# Patient Record
Sex: Female | Born: 1975
Health system: Southern US, Community
[De-identification: ages and names within clinical notes are randomized; demographics above are authoritative.]

## PROBLEM LIST (undated history)

## (undated) DIAGNOSIS — I10 Essential (primary) hypertension: Secondary | ICD-10-CM

## (undated) DIAGNOSIS — Z8489 Family history of other specified conditions: Secondary | ICD-10-CM

## (undated) DIAGNOSIS — Z8669 Personal history of other diseases of the nervous system and sense organs: Secondary | ICD-10-CM

## (undated) DIAGNOSIS — K5792 Diverticulitis of intestine, part unspecified, without perforation or abscess without bleeding: Secondary | ICD-10-CM

## (undated) DIAGNOSIS — R011 Cardiac murmur, unspecified: Secondary | ICD-10-CM

## (undated) DIAGNOSIS — N289 Disorder of kidney and ureter, unspecified: Secondary | ICD-10-CM

## (undated) DIAGNOSIS — K219 Gastro-esophageal reflux disease without esophagitis: Secondary | ICD-10-CM

## (undated) DIAGNOSIS — R1115 Cyclical vomiting syndrome unrelated to migraine: Secondary | ICD-10-CM

## (undated) HISTORY — PX: ABDOMINAL HYSTERECTOMY: SHX81

## (undated) HISTORY — PX: BACK SURGERY: SHX140

## (undated) HISTORY — PX: ECTOPIC PREGNANCY SURGERY: SHX613

## (undated) SURGERY — EGD (ESOPHAGOGASTRODUODENOSCOPY)
Anesthesia: Moderate Sedation | Laterality: Left

---

## 1998-09-21 ENCOUNTER — Emergency Department (HOSPITAL_COMMUNITY): Admission: EM | Admit: 1998-09-21 | Discharge: 1998-09-21 | Payer: Self-pay | Admitting: Emergency Medicine

## 1998-09-22 ENCOUNTER — Encounter: Payer: Self-pay | Admitting: Emergency Medicine

## 1998-12-26 ENCOUNTER — Ambulatory Visit (HOSPITAL_COMMUNITY): Admission: AD | Admit: 1998-12-26 | Discharge: 1998-12-26 | Payer: Self-pay | Admitting: Obstetrics & Gynecology

## 1998-12-26 ENCOUNTER — Encounter: Payer: Self-pay | Admitting: Obstetrics & Gynecology

## 1999-05-06 ENCOUNTER — Encounter: Payer: Self-pay | Admitting: Family Medicine

## 1999-05-06 ENCOUNTER — Emergency Department (HOSPITAL_COMMUNITY): Admission: EM | Admit: 1999-05-06 | Discharge: 1999-05-06 | Payer: Self-pay | Admitting: Family Medicine

## 2000-08-24 ENCOUNTER — Emergency Department (HOSPITAL_COMMUNITY): Admission: EM | Admit: 2000-08-24 | Discharge: 2000-08-24 | Payer: Self-pay | Admitting: Emergency Medicine

## 2000-08-24 ENCOUNTER — Encounter: Payer: Self-pay | Admitting: Emergency Medicine

## 2000-09-08 ENCOUNTER — Emergency Department (HOSPITAL_COMMUNITY): Admission: EM | Admit: 2000-09-08 | Discharge: 2000-09-08 | Payer: Self-pay | Admitting: Emergency Medicine

## 2000-09-09 ENCOUNTER — Encounter: Payer: Self-pay | Admitting: Emergency Medicine

## 2000-09-10 ENCOUNTER — Emergency Department (HOSPITAL_COMMUNITY): Admission: EM | Admit: 2000-09-10 | Discharge: 2000-09-10 | Payer: Self-pay | Admitting: Emergency Medicine

## 2005-08-04 ENCOUNTER — Emergency Department (HOSPITAL_COMMUNITY): Admission: EM | Admit: 2005-08-04 | Discharge: 2005-08-04 | Payer: Self-pay | Admitting: Emergency Medicine

## 2005-10-18 ENCOUNTER — Emergency Department (HOSPITAL_COMMUNITY): Admission: EM | Admit: 2005-10-18 | Discharge: 2005-10-18 | Payer: Self-pay | Admitting: Emergency Medicine

## 2005-10-25 ENCOUNTER — Emergency Department (HOSPITAL_COMMUNITY): Admission: EM | Admit: 2005-10-25 | Discharge: 2005-10-26 | Payer: Self-pay | Admitting: Emergency Medicine

## 2006-01-08 ENCOUNTER — Emergency Department (HOSPITAL_COMMUNITY): Admission: EM | Admit: 2006-01-08 | Discharge: 2006-01-08 | Payer: Self-pay | Admitting: Emergency Medicine

## 2006-05-25 ENCOUNTER — Emergency Department (HOSPITAL_COMMUNITY): Admission: EM | Admit: 2006-05-25 | Discharge: 2006-05-25 | Payer: Self-pay | Admitting: Emergency Medicine

## 2006-10-18 ENCOUNTER — Encounter: Admission: RE | Admit: 2006-10-18 | Discharge: 2006-10-18 | Payer: Self-pay | Admitting: Family Medicine

## 2006-12-07 ENCOUNTER — Inpatient Hospital Stay (HOSPITAL_COMMUNITY): Admission: AD | Admit: 2006-12-07 | Discharge: 2006-12-11 | Payer: Self-pay | Admitting: Internal Medicine

## 2010-11-29 NOTE — H&P (Signed)
Shari Weeks, Shari Weeks              ACCOUNT NO.:  [id number]   MEDICAL RECORD NO.:  [id number]          PATIENT TYPE:  INP   LOCATION:  5125                         FACILITY:  [facility]   PHYSICIAN:  Kela Millin, M.D.DATE OF BIRTH:  1975-11-03   DATE OF ADMISSION:  12/07/2006  DATE OF DISCHARGE:                              HISTORY & PHYSICAL   PRIMARY CARE PHYSICIAN:  Dr Massie Maroon   CHIEF COMPLAINT:  Persistent nausea and vomiting with abdominal pain.   PAST MEDICAL HISTORY:  The patient is a 35 year old black female with  past medical history significant for dyspepsia - ? GERD who presents  with above complaint times three days.  The patient was seen at her  primary care physician's office today prior to admission and was given  several doses of antiemetics but her nausea and vomiting persisted and  so she was directly admitted to the [facility] for  further evaluation and management.  Per her physician's assistant the  patient had been on Protonix until a few weeks ago when she stopped  taking it (the patient on interview states that she does not remember  when she stopped it).  She states that when her symptoms began she went  to [facility] on May 21 and a urinalysis was done and her per  her PA this was negative for blood, it was also negative for infection.  A CT scan of her abdomen was also done and this was negative for any  acute findings.  It was noted that the patient did have a single non-  obstructing calculi in the lower pole collecting system of the right  kidney and it was unchanged from the study of January 13, 2006.  The  patient was hydrated with IV fluids and discharged home to follow up  with her primary care physician.  Shari Weeks states that she has  continued to have the nausea and vomiting multiple times a day as well  as abdominal pain - epigastric in location, intermittent - she states  she has not pain attention to how long it  lasts at the time, a burning  sensation.  At her primary care's office she continued to vomit as  already mentioned above and the PA reported that the vomitus had some  dark bloody streaks.  The patient denies fever, diarrhea, dysuria,  cough, melena, and no hematochezia.  She denies alcohol use.   PAST MEDICAL HISTORY:  1. As stated above.  2. History of kidney stones.   MEDICATIONS:  None.   ALLERGIES:  CIPRO.   SOCIAL HISTORY:  Denies tobacco, she also denies alcohol.   FAMILY HISTORY:  Her father and aunt have diabetes.  Her grandmother had  ovarian cancer and her aunt also has breast cancer.   REVIEW OF SYSTEMS:  As per HPI, other review of systems negative.   PHYSICAL EXAMINATION:  GENERAL:  The patient is a young black female, in  mild to moderate distress secondary to the epigastric pain.  VITAL SIGNS:  Blood pressure is 137/81 with a temperature of 98.8, pulse  of 62,  respiratory rate of 20.  HEENT:  PERRLA, EOMI, slightly dry mucous membranes, no oral exudates.  NECK:  Supple, no adenopathy and no thyromegaly.  LUNGS:  Clear to auscultation bilaterally.  No crackles or wheezes.  CARDIOVASCULAR:  Regular rate and rhythm, normal S1 and S2.  ABDOMEN:  Epigastric tenderness, no rebound tenderness, bowel sounds  present, soft, nondistended, no hepatosplenomegaly and no masses  palpable.  EXTREMITIES:  No cyanosis, no edema.  NEURO:  Alert and oriented times three.  Cranial nerves II-XII grossly  intact, nonfocal exam.   LABORATORY DATA:  Her lipase is 31, amylase is 123, sodium 391,  potassium 3.1, chloride 102, CO2 28, BUN 7, creatinine 0.8, glucose 113,  LFTs are within normal limits, white cell count is 12.9 with a  hemoglobin of 12.9, hematocrit 39.2, platelet count of 308.   ASSESSMENT/PLAN:  Abdominal pain - with nausea and vomiting.  Peptic  ulcer disease versus gastritis versus GERD, also obtain urinalysis with  culture and sensitivity, and consider abdominal  ultrasound as  appropriate to evaluate for gallbladder disease/gallstones.  As noted  above LFTs are within normal limits and also serum amylase and lipase  are negative.  I will place the patient on PPI, Reglan, antiemetics,  keep n.p.o. for now, hydrate, follow and consider GI consultation as  appropriate.      Kela Millin, M.D.  Electronically Signed     ACV/MEDQ  D:  12/08/2006  T:  12/08/2006  Job:  [id number]   cc:   Altamease Oiler C. Merilynn Finland, M.D.  Gretta Arab Valentina Lucks, M.D.

## 2010-12-02 NOTE — Discharge Summary (Signed)
Shari Weeks, Shari Weeks              ACCOUNT NO.:  [id number]   MEDICAL RECORD NO.:  [id number]          PATIENT TYPE:  INP   LOCATION:  5125                         FACILITY:  [facility]   PHYSICIAN:  Kela Millin, M.D.DATE OF BIRTH:  1976/04/22   DATE OF ADMISSION:  12/07/2006  DATE OF DISCHARGE:  12/11/2006                               DISCHARGE SUMMARY   DISCHARGE DIAGNOSES:  1. Abdominal pain, likely secondary to severe gastroesophageal reflux      disease.  2. Volume depletion, resolved.  3. Hypokalemia, resolved.   BRIEF HISTORY AND PHYSICAL EXAMINATION:  The patient is a 35 year old  black female with past medical history significant for dyspepsia, ?  GERD, who presented with complaints of persistent nausea and vomiting as  well as abdominal pain for three days.  She had seen her primary care  Shari Weeks on the day of admission and was given several doses of  antiemetics but her nausea and vomiting persisted.  It was also reported  per the primary care physicians's office, as well as the patient, that  she had been noncompliant with her Protonix for a good while.  The  patient also reported that she had gone to [facility]  about two days prior to her admission and work up including a  urinalysis, CT scan of her abdomen were done and these were negative for  acute findings.  It was noted that she had a single nonobstructing  calculus in the lower pole of her right kidney but that was unchanged  from her study of June 2007.  The patient describes the pain as  epigastric in location, intermittent and felt like a burning sensation.  She stated that her vomitus had some dark bloody streaks.  She denied  fevers, diarrhea, dysuria, cough, melena and no hematochezia.  She  denied alcohol use.   PHYSICAL EXAMINATION:  VITAL SIGNS:  Her blood pressure upon admission  revealed a blood pressure of 137/81, temperature 98.8, pulse of 62,  respiratory rate of 20.   Pertinent findings on exam include:  HEENT:  Slightly dry mucous membranes.  ABDOMEN:  On her exam, epigastric tenderness, no rebound tenderness.  Bowel sounds were present, nondistended.  No hepatosplenomegaly and no  masses palpable.  The rest of her physical examination was within normal  limits.   LABORATORY DATA:  Lipase 31, amylase 123.  Her sodium was 139, potassium  of 3.1, chloride of 102, CO2 28.  BUN 7, creatinine 0.8, glucose of 113.  Liver function tests within normal limits.  White blood cell count of  12.9, hemoglobin 12.9, hematocrit of 39.2, platelet count of 309,000.   HOSPITAL COURSE:  PROBLEM #1:  EPIGASTRIC PAIN WITH PERSISTENT NAUSEA  AND VOMITING:  Upon admission the patient was kept NPO, hydrated with IV  fluids and started on IV PPI.  She was also placed on IV Reglan and  antiemetics as needed.  The patient's symptoms gradually improved and it  was noted that she had also had an abdominal ultrasound done at [facility] which was read as negative for acute  findings.  She did not have any hematemesis, melena or hematochezia during her  hospital stay and remained hemodynamically stable.  Carafate was also  added to her treatment regimen.  With these interventions, the patient's  symptoms gradually resolved and she was then started on a clear liquid  diet and advanced as tolerated.  On followup on Dec 11, 2006 the  patient's nurse reported that she had tolerated her regular diet without  any difficulty and had decided to sign out AMA, and did not want to wait  for her prescriptions.  The patient indicated that she would follow up  with her primary care physician.   PROBLEM #2:  VOLUME DEPLETION:  The patient was hydrated during her  hospital stay, and was tolerating a regular diet prior to leaving the  hospital.   PROBLEM #3: HYPOKALEMIA:  Her potassium was replaced during her hospital  stay.  Her last potassium prior to leaving the hospital was  3.7.   DISCHARGE MEDICATIONS:  As indicated above, the patient left AMA.  She  had been on:  1. Protonix 40 mg b.i.d.  2. Reglan 10 mg q.a.c. and q.h.s.  3. Carafate 1 gram p.o. q.i.d. during her hospital stay.   FOLLOWUP CARE:  Primary care physician.   CONDITION ON DISCHARGE:  Improved, stable.      Kela Millin, M.D.  Electronically Signed     ACV/MEDQ  D:  01/31/2007  T:  01/31/2007  Job:  [id number]   cc:   [name], M.D.

## 2011-02-26 ENCOUNTER — Other Ambulatory Visit: Payer: Self-pay

## 2011-02-26 ENCOUNTER — Emergency Department (HOSPITAL_BASED_OUTPATIENT_CLINIC_OR_DEPARTMENT_OTHER)
Admission: EM | Admit: 2011-02-26 | Discharge: 2011-02-26 | Disposition: A | Discharge status: Home / Self Care | Payer: Self-pay | Attending: Emergency Medicine | Admitting: Emergency Medicine

## 2011-02-26 DIAGNOSIS — R112 Nausea with vomiting, unspecified: Secondary | ICD-10-CM | POA: Insufficient documentation

## 2011-02-26 DIAGNOSIS — F17200 Nicotine dependence, unspecified, uncomplicated: Secondary | ICD-10-CM | POA: Insufficient documentation

## 2011-02-26 DIAGNOSIS — R5381 Other malaise: Secondary | ICD-10-CM | POA: Insufficient documentation

## 2011-02-26 DIAGNOSIS — R5383 Other fatigue: Secondary | ICD-10-CM | POA: Insufficient documentation

## 2011-02-26 DIAGNOSIS — R079 Chest pain, unspecified: Secondary | ICD-10-CM | POA: Insufficient documentation

## 2011-02-26 DIAGNOSIS — R109 Unspecified abdominal pain: Secondary | ICD-10-CM | POA: Insufficient documentation

## 2011-02-26 LAB — CBC
HCT: 35.4 % — ABNORMAL LOW (ref 36.0–46.0)
Hemoglobin: 11.5 g/dL — ABNORMAL LOW (ref 12.0–15.0)
MCHC: 32.5 g/dL (ref 30.0–36.0)

## 2011-02-26 LAB — COMPREHENSIVE METABOLIC PANEL
Alkaline Phosphatase: 65 U/L (ref 39–117)
BUN: 16 mg/dL (ref 6–23)
Chloride: 97 mEq/L (ref 96–112)
GFR calc Af Amer: >60 mL/min (ref >60)
Glucose, Bld: 124 mg/dL — ABNORMAL HIGH (ref 70–99)
Potassium: 3.2 mEq/L — ABNORMAL LOW (ref 3.5–5.1)
Total Bilirubin: 0.8 mg/dL (ref 0.3–1.2)

## 2011-02-26 LAB — URINALYSIS, ROUTINE W REFLEX MICROSCOPIC
Bilirubin Urine: NEGATIVE
Ketones, ur: >80 mg/dL — AB
Nitrite: NEGATIVE
Urobilinogen, UA: 0.2 mg/dL (ref 0.0–1.0)

## 2011-02-26 LAB — LIPASE, BLOOD: Lipase: 18 U/L (ref 11–59)

## 2011-02-26 MED ORDER — ONDANSETRON HCL 4 MG/2ML IJ SOLN
4.0000 mg | Freq: Once | INTRAMUSCULAR | Status: AC
  Administered 2011-02-26: 4 mg via INTRAVENOUS
  Filled 2011-02-26: qty 2

## 2011-02-26 MED ORDER — PANTOPRAZOLE SODIUM 40 MG IV SOLR
40.0000 mg | Freq: Once | INTRAVENOUS | Status: AC
  Administered 2011-02-26: 40 mg via INTRAVENOUS
  Filled 2011-02-26: qty 40

## 2011-02-26 MED ORDER — METOCLOPRAMIDE HCL 10 MG PO TABS: 10.0000 mg | ORAL_TABLET | Freq: Four times a day (QID) | ORAL | Status: DC

## 2011-02-26 MED ORDER — SODIUM CHLORIDE 0.9 % IV SOLN: Freq: Once | INTRAVENOUS | Status: DC

## 2011-02-26 MED ORDER — HYDROMORPHONE HCL 1 MG/ML IJ SOLN
1.0000 mg | Freq: Once | INTRAMUSCULAR | Status: AC
  Administered 2011-02-26: 1 mg via INTRAVENOUS
  Filled 2011-02-26: qty 1

## 2011-02-26 MED ORDER — SODIUM CHLORIDE 0.9 % IV BOLUS (SEPSIS)
1000.0000 mL | Freq: Once | INTRAVENOUS | Status: AC
  Administered 2011-02-26: 1000 mL via INTRAVENOUS

## 2011-02-26 MED ORDER — METOCLOPRAMIDE HCL 5 MG/ML IJ SOLN
INTRAMUSCULAR | Status: AC
  Administered 2011-02-26: 10 mg via INTRAVENOUS
  Filled 2011-02-26: qty 2

## 2011-02-26 MED ORDER — METOCLOPRAMIDE HCL 5 MG/ML IJ SOLN
10.0000 mg | Freq: Once | INTRAMUSCULAR | Status: AC
  Administered 2011-02-26: 10 mg via INTRAVENOUS

## 2011-02-26 NOTE — ED Notes (Signed)
Patient is resting comfortably.Family at side IVF bolus infusion complete

## 2011-02-26 NOTE — ED Notes (Signed)
Report of CT scan of abdomen and pelvis done 02/24/2011 at [facility] was obtained. Scan showed diverticulosis without any acute process.  Dione Booze, MD 02/27/11 [phone]

## 2011-02-26 NOTE — ED Notes (Signed)
Pt verbalizes need for increased hydration and follow up as needed

## 2011-02-26 NOTE — ED Notes (Signed)
She is feeling better after IV hydration and antiemetics. She says she sees a gastroenterologist for this problem. Re-exam: only mild abdominal tenderness with no rebound or guarding.  Dione Booze, MD 02/26/11 1058

## 2011-02-26 NOTE — ED Notes (Signed)
Pt presents with husband at bedside, pt lethargic, no responding to any questions by this RN however she will answer the questions posed by her husband.  Per husband, pt has been to [facility] and was not told anything, but pt presents this mornign with cp, shortness of breath, lethargic "for a couple of days" per husband.  Nauseated, vomited.

## 2011-02-26 NOTE — ED Notes (Signed)
Multiple attempts at IV access have been unsuccessful by multiple people.  Pt is now communicative with staff and states that she has a history of difficult IV access.

## 2011-02-26 NOTE — ED Provider Notes (Signed)
History     CSN: [id number] Arrival date & time: 02/26/2011  6:04 AM  Chief Complaint  Patient presents with  . Emesis  . Weakness   HPI Pt reports she has had 3 days of persistent vomiting, unable to keep any fluids down during that time. She has since developed diffuse abdomen and chest pains, no diarrhea and no fever. She was seen at [facility] ED 2 days ago for same, given phenergan and IVF. States had neg labs and CT. Has not gotten better. Pt is answering all questions appropriately for me.  History reviewed. No pertinent past medical history.  History reviewed. No pertinent past surgical history.  History reviewed. No pertinent family history.  History  Substance Use Topics  . Smoking status: Current Everyday Smoker -- 0.5 packs/day    Types: Cigarettes  . Smokeless tobacco: Not on file  . Alcohol Use: No     occasional    OB History    Grav Para Term Preterm Abortions TAB SAB Ect Mult Living                  Review of Systems All other systems reviewed and are negative except as noted in HPI.   Physical Exam  BP 173/94  Pulse 57  Temp(Src) 98.5 F (36.9 C) (Oral)  Resp 17  SpO2 100%  LMP 02/19/2011  Physical Exam  Nursing note and vitals reviewed. Constitutional: She is oriented to person, place, and time. She appears well-developed and well-nourished.       Uncomfortable appearing  HENT:  Head: Normocephalic and atraumatic.       Mouth dry  Eyes: EOM are normal. Pupils are equal, round, and reactive to light.  Neck: Normal range of motion. Neck supple.  Cardiovascular: Normal rate, normal heart sounds and intact distal pulses.   Pulmonary/Chest: Effort normal and breath sounds normal.  Abdominal: Soft. She exhibits no distension. There is tenderness (diffuse tenderness, no focal tenderness, no peritoneal signs). There is no rebound and no guarding.  Musculoskeletal: Normal range of motion. She exhibits no edema and no tenderness.  Neurological: She is alert  and oriented to person, place, and time. No cranial nerve deficit.  Skin: Skin is warm and dry. No rash noted.  Psychiatric: She has a normal mood and affect.    ED Course  Angiocath insertion Date/Time: 02/26/2011 6:54 AM Performed by: Susy Frizzle B. Authorized by: Pollyann Savoy Consent: Verbal consent obtained. Preparation: Patient was prepped and draped in the usual sterile fashion. Patient tolerance: Patient tolerated the procedure well with no immediate complications. Comments: 20ga in R EJ, withdraw and flush without difficulty    MDM  Date: 02/26/2011  Rate: 56  Rhythm: normal sinus rhythm  QRS Axis: normal  Intervals: normal  ST/T Wave abnormalities: normal  Conduction Disutrbances:none  Narrative Interpretation: PVCs  Old EKG Reviewed: unchanged from 12/07/2006      Care signed out to Dr. Preston Fleeting at the change of shift.  Charles B. Bernette Mayers, MD 02/28/11 1329

## 2011-03-25 ENCOUNTER — Emergency Department (INDEPENDENT_AMBULATORY_CARE_PROVIDER_SITE_OTHER): Payer: Self-pay

## 2011-03-25 ENCOUNTER — Encounter (HOSPITAL_BASED_OUTPATIENT_CLINIC_OR_DEPARTMENT_OTHER): Payer: Self-pay | Admitting: *Deleted

## 2011-03-25 ENCOUNTER — Emergency Department (HOSPITAL_BASED_OUTPATIENT_CLINIC_OR_DEPARTMENT_OTHER)
Admission: EM | Admit: 2011-03-25 | Discharge: 2011-03-25 | Disposition: A | Discharge status: Home / Self Care | Payer: Self-pay | Attending: Emergency Medicine | Admitting: Emergency Medicine

## 2011-03-25 ENCOUNTER — Other Ambulatory Visit: Payer: Self-pay

## 2011-03-25 DIAGNOSIS — R112 Nausea with vomiting, unspecified: Secondary | ICD-10-CM | POA: Insufficient documentation

## 2011-03-25 DIAGNOSIS — F17200 Nicotine dependence, unspecified, uncomplicated: Secondary | ICD-10-CM | POA: Insufficient documentation

## 2011-03-25 DIAGNOSIS — R1110 Vomiting, unspecified: Secondary | ICD-10-CM

## 2011-03-25 DIAGNOSIS — R011 Cardiac murmur, unspecified: Secondary | ICD-10-CM

## 2011-03-25 LAB — COMPREHENSIVE METABOLIC PANEL
ALT: 14 U/L (ref 0–35)
CO2: 19 mEq/L (ref 19–32)
Calcium: 9.4 mg/dL (ref 8.4–10.5)
GFR calc Af Amer: >60 mL/min (ref >60)
GFR calc non Af Amer: >60 mL/min (ref >60)
Glucose, Bld: 153 mg/dL — ABNORMAL HIGH (ref 70–99)
Sodium: 134 mEq/L — ABNORMAL LOW (ref 135–145)
Total Bilirubin: 0.4 mg/dL (ref 0.3–1.2)

## 2011-03-25 LAB — URINALYSIS, ROUTINE W REFLEX MICROSCOPIC
Ketones, ur: >80 mg/dL — AB
Leukocytes, UA: NEGATIVE
Nitrite: NEGATIVE
Specific Gravity, Urine: 1.028 (ref 1.005–1.030)
pH: 6 (ref 5.0–8.0)

## 2011-03-25 LAB — CBC
Platelets: 263 10*3/uL (ref 150–400)
RBC: 4.5 MIL/uL (ref 3.87–5.11)
WBC: 12.7 10*3/uL — ABNORMAL HIGH (ref 4.0–10.5)

## 2011-03-25 LAB — URINE MICROSCOPIC-ADD ON

## 2011-03-25 MED ORDER — SODIUM CHLORIDE 0.9 % IV BOLUS (SEPSIS)
1000.0000 mL | Freq: Once | INTRAVENOUS | Status: AC
  Administered 2011-03-25: 1000 mL via INTRAVENOUS

## 2011-03-25 MED ORDER — ONDANSETRON HCL 4 MG/2ML IJ SOLN
4.0000 mg | Freq: Once | INTRAMUSCULAR | Status: AC
  Administered 2011-03-25: 4 mg via INTRAVENOUS
  Filled 2011-03-25: qty 2

## 2011-03-25 MED ORDER — PROMETHAZINE HCL 25 MG PO TABS: 25.0000 mg | ORAL_TABLET | Freq: Four times a day (QID) | ORAL | Status: DC | PRN

## 2011-03-25 MED ORDER — PROMETHAZINE HCL 25 MG RE SUPP: 25.0000 mg | Freq: Four times a day (QID) | RECTAL | Status: DC | PRN

## 2011-03-25 MED ORDER — PROMETHAZINE HCL 25 MG/ML IJ SOLN
12.5000 mg | Freq: Once | INTRAMUSCULAR | Status: AC
  Administered 2011-03-25: 12.5 mg via INTRAVENOUS
  Filled 2011-03-25: qty 1

## 2011-03-25 MED ORDER — ONDANSETRON HCL 4 MG PO TABS: 4.0000 mg | ORAL_TABLET | Freq: Four times a day (QID) | ORAL | Status: AC

## 2011-03-25 MED ORDER — ACETAMINOPHEN-CODEINE #3 300-30 MG PO TABS: 1.0000 | ORAL_TABLET | Freq: Four times a day (QID) | ORAL | Status: AC | PRN

## 2011-03-25 MED ORDER — SODIUM CHLORIDE 0.9 % IV SOLN
8.0000 mg | Freq: Once | INTRAVENOUS | Status: AC
  Administered 2011-03-25: 8 mg via INTRAVENOUS
  Filled 2011-03-25: qty 4

## 2011-03-25 MED ORDER — KETOROLAC TROMETHAMINE 30 MG/ML IJ SOLN
30.0000 mg | Freq: Once | INTRAMUSCULAR | Status: AC
  Administered 2011-03-25: 30 mg via INTRAVENOUS
  Filled 2011-03-25: qty 1

## 2011-03-25 MED ORDER — FENTANYL CITRATE 0.05 MG/ML IJ SOLN
50.0000 ug | Freq: Once | INTRAMUSCULAR | Status: AC
  Administered 2011-03-25: 50 ug via INTRAVENOUS
  Filled 2011-03-25: qty 2

## 2011-03-25 MED ORDER — PANTOPRAZOLE SODIUM 40 MG IV SOLR
40.0000 mg | Freq: Once | INTRAVENOUS | Status: AC
  Administered 2011-03-25: 40 mg via INTRAVENOUS
  Filled 2011-03-25: qty 40

## 2011-03-25 MED ORDER — GI COCKTAIL ~~LOC~~
30.0000 mL | Freq: Once | ORAL | Status: AC
  Administered 2011-03-25: 30 mL via ORAL
  Filled 2011-03-25: qty 30

## 2011-03-25 MED ORDER — DROPERIDOL 2.5 MG/ML IJ SOLN
2.5000 mg | Freq: Once | INTRAMUSCULAR | Status: AC
  Administered 2011-03-25: 2.5 mg via INTRAVENOUS
  Filled 2011-03-25: qty 2

## 2011-03-25 NOTE — ED Provider Notes (Signed)
History     CSN: [id number] Arrival date & time: 03/25/2011  4:37 AM  Chief Complaint  Patient presents with  . Emesis   Patient is a 35 y.o. female presenting with vomiting. The history is provided by the patient. No language interpreter was used.  Emesis  This is a recurrent problem. The current episode started more than 2 days ago. The problem occurs 5 to 10 times per day. The problem has not changed since onset.The emesis has an appearance of stomach contents. There has been no fever. Associated symptoms include abdominal pain and diarrhea. Pertinent negatives include no arthralgias, no cough, no fever, no headaches, no myalgias, no sweats and no URI. Risk factors include ill contacts.  States she gets this vomiting and abdominal pain related to her GERD about every 3 weeks what is different this time is the diarrhea.  Denies urinary symptoms.  Denies vaginal symptoms  History reviewed. No pertinent past medical history.  History reviewed. No pertinent past surgical history.  History reviewed. No pertinent family history.  History  Substance Use Topics  . Smoking status: Current Everyday Smoker -- 0.5 packs/day    Types: Cigarettes  . Smokeless tobacco: Not on file  . Alcohol Use: No     occasional    OB History    Grav Para Term Preterm Abortions TAB SAB Ect Mult Living                  Review of Systems  Constitutional: Negative for fever.  HENT: Negative for facial swelling.   Eyes: Negative for discharge.  Respiratory: Negative for cough, shortness of breath, wheezing and stridor.   Cardiovascular: Negative for chest pain, palpitations and leg swelling.  Gastrointestinal: Positive for vomiting, abdominal pain and diarrhea. Negative for abdominal distention.  Genitourinary: Negative for dysuria, frequency, flank pain, genital sores, menstrual problem and pelvic pain.  Musculoskeletal: Negative for myalgias and arthralgias.  Neurological: Negative for headaches.    Hematological: Negative.   Psychiatric/Behavioral: Negative.     Physical Exam  BP 172/100  Pulse 59  Temp(Src) 98.3 F (36.8 C) (Oral)  Resp 18  SpO2 97%  LMP 02/19/2011  Physical Exam  Constitutional: She is oriented to person, place, and time. She appears well-developed and well-nourished. No distress.  HENT:  Head: Normocephalic and atraumatic.  Eyes: EOM are normal. Pupils are equal, round, and reactive to light.  Neck: Normal range of motion. Neck supple.  Cardiovascular: Normal rate and regular rhythm.   Pulmonary/Chest: Breath sounds normal. No respiratory distress. She has no wheezes. She has no rales.  Abdominal: Soft. Bowel sounds are normal. She exhibits no distension and no mass. There is tenderness. There is no rebound and no guarding.  Musculoskeletal: Normal range of motion.  Neurological: She is alert and oriented to person, place, and time.  Skin: Skin is warm and dry.  Psychiatric: She has a normal mood and affect.    ED Course  Procedures  MDM Signed out to Dr. Juleen China pending Comp metabolic      Jumanah Hynson K Gerod Caligiuri-Rasch, MD 03/25/11 [phone]

## 2011-03-25 NOTE — ED Notes (Signed)
Arterial Stick done to collect blood sample per MD order.  Pt tolerated well.

## 2011-03-25 NOTE — ED Notes (Addendum)
Pt presents to ED today with vomitting since 9/7 at 1300.  Pt reports no changes in diet.  Pt reports cold and URI sx.  Pt has hx of same and states it happens about every 3 weeks.  Pt takes Priolsec at home and did not take any additional medications PTA.

## 2011-03-25 NOTE — ED Notes (Signed)
After receiving medications pt more concerned with having the lights out and a blanket then with POC .  Pt has not vomitted since arrival to room

## 2011-03-25 NOTE — ED Provider Notes (Signed)
History     CSN: [id number] Arrival date & time: 03/25/2011  4:37 AM  Chief Complaint  Patient presents with  . Emesis   HPI  History reviewed. No pertinent past medical history.  History reviewed. No pertinent past surgical history.  History reviewed. No pertinent family history.  History  Substance Use Topics  . Smoking status: Current Everyday Smoker -- 0.5 packs/day    Types: Cigarettes  . Smokeless tobacco: Not on file  . Alcohol Use: No     occasional    OB History    Grav Para Term Preterm Abortions TAB SAB Ect Mult Living                  Review of Systems  Physical Exam  BP 191/98  Pulse 52  Temp(Src) 98.3 F (36.8 C) (Oral)  Resp 19  SpO2 100%  LMP 02/19/2011  Physical Exam  ED Course  Procedures    MDM Dispo delayed due to delay in obtaining CMP. Pt redosed with additional meds. States still having pain and nausea but improved and would like to go home. Pt with no new complaints. Multiple evaluations for similar symptoms without clear etiology. Says will f/u with GI. Understands signs/symptoms to return for immediate re-eval.      Raeford Razor, MD 03/25/11 [phone]

## 2011-08-16 ENCOUNTER — Encounter (HOSPITAL_BASED_OUTPATIENT_CLINIC_OR_DEPARTMENT_OTHER): Payer: Self-pay | Admitting: *Deleted

## 2011-08-16 ENCOUNTER — Emergency Department (HOSPITAL_BASED_OUTPATIENT_CLINIC_OR_DEPARTMENT_OTHER)
Admission: EM | Admit: 2011-08-16 | Discharge: 2011-08-17 | Disposition: A | Discharge status: Home / Self Care | Payer: Self-pay | Attending: Emergency Medicine | Admitting: Emergency Medicine

## 2011-08-16 DIAGNOSIS — R112 Nausea with vomiting, unspecified: Secondary | ICD-10-CM | POA: Insufficient documentation

## 2011-08-16 DIAGNOSIS — K219 Gastro-esophageal reflux disease without esophagitis: Secondary | ICD-10-CM | POA: Insufficient documentation

## 2011-08-16 DIAGNOSIS — F17200 Nicotine dependence, unspecified, uncomplicated: Secondary | ICD-10-CM | POA: Insufficient documentation

## 2011-08-16 HISTORY — DX: Personal history of other diseases of the nervous system and sense organs: Z86.69

## 2011-08-16 HISTORY — DX: Gastro-esophageal reflux disease without esophagitis: K21.9

## 2011-08-16 LAB — URINE MICROSCOPIC-ADD ON

## 2011-08-16 LAB — URINALYSIS, ROUTINE W REFLEX MICROSCOPIC
Ketones, ur: 15 mg/dL — AB
Protein, ur: 30 mg/dL — AB
Urobilinogen, UA: 1 mg/dL (ref 0.0–1.0)

## 2011-08-16 MED ORDER — SODIUM CHLORIDE 0.9 % IV BOLUS (SEPSIS)
2000.0000 mL | Freq: Once | INTRAVENOUS | Status: AC
  Administered 2011-08-17: 2000 mL via INTRAVENOUS

## 2011-08-16 MED ORDER — DIPHENHYDRAMINE HCL 50 MG/ML IJ SOLN
25.0000 mg | Freq: Once | INTRAMUSCULAR | Status: AC
  Administered 2011-08-17: 25 mg via INTRAVENOUS
  Filled 2011-08-16: qty 1

## 2011-08-16 MED ORDER — FENTANYL CITRATE 0.05 MG/ML IJ SOLN
50.0000 ug | INTRAMUSCULAR | Status: DC | PRN
  Filled 2011-08-16: qty 2

## 2011-08-16 MED ORDER — METOCLOPRAMIDE HCL 5 MG/ML IJ SOLN
10.0000 mg | Freq: Once | INTRAMUSCULAR | Status: AC
  Administered 2011-08-17: 10 mg via INTRAVENOUS
  Filled 2011-08-16: qty 2

## 2011-08-16 NOTE — ED Notes (Signed)
C/o upper abd pain and vomiting for few weeks

## 2011-08-16 NOTE — ED Provider Notes (Signed)
History     CSN: [id number]  Arrival date & time 08/16/11  2320   First MD Initiated Contact with Patient 08/16/11 2332      Chief Complaint  Patient presents with  . Nausea and vomiting     (Consider location/radiation/quality/duration/timing/severity/associated sxs/prior treatment) HPI This is a 36 year old black female with several year history of nausea and vomiting. She states these episodes occur almost daily. They have been especially bad since November of last year. She has had extensive workup including CT scan, ultrasound, lab work and multiple upper and lower endoscopies. Her gastroenterologist has not been able to make a definitive diagnosis. She is on 80 mg of omeprazole daily which is not given her relief. She states her gastroenterologist is reticent to prescribe antiemetics. She is here with an episode that began about 41 PM and she came here from work. She describes the symptoms as severe. She states she cannot keep anything on her stomach. It is accompanied by moderate to severe epigastric pain but she states that the pain is not as severe as the nausea and vomiting. She denies diarrhea. She states she has lost about 15 pounds since November.  Past Medical History  Diagnosis Date  . GERD (gastroesophageal reflux disease)   . History of migraine     History reviewed. No pertinent past surgical history.  Family History  Problem Relation Age of Onset  . Migraines Mother     History  Substance Use Topics  . Smoking status: Current Everyday Smoker -- 0.5 packs/day    Types: Cigarettes  . Smokeless tobacco: Not on file  . Alcohol Use: No     occasional    OB History    Grav Para Term Preterm Abortions TAB SAB Ect Mult Living                  Review of Systems  All other systems reviewed and are negative.    Allergies  Ciprofloxacin  Home Medications   Current Outpatient Rx  Name Route Sig Dispense Refill  . OMEPRAZOLE MAGNESIUM 20 MG PO TBEC Oral  Take 20 mg by mouth daily.        BP 140/99  Pulse 80  Temp(Src) 99.7 F (37.6 C) (Oral)  Resp 18  Ht 5\' 11"  (1.803 m)  Wt 170 lb (77.111 kg)  BMI 23.71 kg/m2  SpO2 100%  LMP 08/13/2011  Physical Exam General: Well-developed, well-nourished female in no acute distress; appearance consistent with age of record HENT: normocephalic, atraumatic Eyes: pupils equal round and reactive to light; extraocular muscles intact Neck: supple Heart: regular rate and rhythm Lungs: clear to auscultation bilaterally Abdomen: soft; epigastric tenderness; nondistended; no masses or hepatosplenomegaly; bowel sounds present Extremities: No deformity; full range of motion Neurologic: Awake, alert and oriented; motor function intact in all extremities and symmetric; no facial droop Skin: Warm and dry Psychiatric: Tearful; frustrated    ED Course  Procedures (including critical care time)     MDM   Nursing notes and vitals signs, including pulse oximetry, reviewed.  Summary of this visit's results, reviewed by myself:  Labs:  Results for orders placed during the hospital encounter of 08/16/11  URINALYSIS, ROUTINE W REFLEX MICROSCOPIC      Component Value Range   Color, Urine AMBER (*) YELLOW    APPearance CLOUDY (*) CLEAR    Specific Gravity, Urine 1.038 (*) 1.005 - 1.030    pH 6.0  5.0 - 8.0    Glucose, UA NEGATIVE  NEGATIVE (mg/dL)   Hgb urine dipstick MODERATE (*) NEGATIVE    Bilirubin Urine SMALL (*) NEGATIVE    Ketones, ur 15 (*) NEGATIVE (mg/dL)   Protein, ur 30 (*) NEGATIVE (mg/dL)   Urobilinogen, UA 1.0  0.0 - 1.0 (mg/dL)   Nitrite NEGATIVE  NEGATIVE    Leukocytes, UA SMALL (*) NEGATIVE   PREGNANCY, URINE      Component Value Range   Preg Test, Ur NEGATIVE  NEGATIVE   URINE MICROSCOPIC-ADD ON      Component Value Range   Squamous Epithelial / LPF FEW (*) RARE    WBC, UA 0-2  <3 (WBC/hpf)   RBC / HPF 3-6  <3 (RBC/hpf)   Bacteria, UA FEW (*) RARE    Crystals CA OXALATE  CRYSTALS (*) NEGATIVE    Urine-Other MUCOUS PRESENT    CBC      Component Value Range   WBC 10.6 (*) 4.0 - 10.5 (K/uL)   RBC 4.02  3.87 - 5.11 (MIL/uL)   Hemoglobin 10.5 (*) 12.0 - 15.0 (g/dL)   HCT 40.9 (*) 81.1 - 46.0 (%)   MCV 81.1  78.0 - 100.0 (fL)   MCH 26.1  26.0 - 34.0 (pg)   MCHC 32.2  30.0 - 36.0 (g/dL)   RDW 91.4 (*) 78.2 - 15.5 (%)   Platelets 290  150 - 400 (K/uL)  DIFFERENTIAL      Component Value Range   Neutrophils Relative 61  43 - 77 (%)   Neutro Abs 6.4  1.7 - 7.7 (K/uL)   Lymphocytes Relative 32  12 - 46 (%)   Lymphs Abs 3.4  0.7 - 4.0 (K/uL)   Monocytes Relative 6  3 - 12 (%)   Monocytes Absolute 0.7  0.1 - 1.0 (K/uL)   Eosinophils Relative 1  0 - 5 (%)   Eosinophils Absolute 0.1  0.0 - 0.7 (K/uL)   Basophils Relative 0  0 - 1 (%)   Basophils Absolute 0.0  0.0 - 0.1 (K/uL)  COMPREHENSIVE METABOLIC PANEL      Component Value Range   Sodium 140  135 - 145 (mEq/L)   Potassium 3.7  3.5 - 5.1 (mEq/L)   Chloride 105  96 - 112 (mEq/L)   CO2 24  19 - 32 (mEq/L)   Glucose, Bld 100 (*) 70 - 99 (mg/dL)   BUN 11  6 - 23 (mg/dL)   Creatinine, Ser 9.56  0.50 - 1.10 (mg/dL)   Calcium 9.1  8.4 - 21.3 (mg/dL)   Total Protein 7.2  6.0 - 8.3 (g/dL)   Albumin 4.2  3.5 - 5.2 (g/dL)   AST 14  0 - 37 (U/L)   ALT <5  0 - 35 (U/L)   Alkaline Phosphatase 58  39 - 117 (U/L)   Total Bilirubin 0.4  0.3 - 1.2 (mg/dL)   GFR calc non Af Amer 83 (*) >90 (mL/min)   GFR calc Af Amer >90  >90 (mL/min)  LIPASE, BLOOD      Component Value Range   Lipase 15  11 - 59 (U/L)   2:02 AM The patient feels better now after 2 L of IV fluids, Benadryl, Reglan and Zofran IV. Patient's personal history and family history of migraine, which were not previously noted in the chart, suggests the patient may have cyclic vomiting syndrome or similar syndrome. We will refer her both to gastroenterology, as she has requested a second opinion, as well as neurology.  The patient was informed about  cyclic  vomiting syndrome but told that this was not a formal diagnosis at this time but part of the differential diagnosis.  Hanley Seamen, MD 08/17/11 [phone]

## 2011-08-17 ENCOUNTER — Encounter (HOSPITAL_BASED_OUTPATIENT_CLINIC_OR_DEPARTMENT_OTHER): Payer: Self-pay | Admitting: Emergency Medicine

## 2011-08-17 ENCOUNTER — Other Ambulatory Visit: Payer: Self-pay | Admitting: Obstetrics & Gynecology

## 2011-08-17 DIAGNOSIS — Z1231 Encounter for screening mammogram for malignant neoplasm of breast: Secondary | ICD-10-CM

## 2011-08-17 LAB — DIFFERENTIAL
Basophils Absolute: 0 10*3/uL (ref 0.0–0.1)
Lymphocytes Relative: 32 % (ref 12–46)
Lymphs Abs: 3.4 10*3/uL (ref 0.7–4.0)
Neutrophils Relative %: 61 % (ref 43–77)

## 2011-08-17 LAB — COMPREHENSIVE METABOLIC PANEL
ALT: <5 U/L (ref 0–35)
AST: 14 U/L (ref 0–37)
Alkaline Phosphatase: 58 U/L (ref 39–117)
CO2: 24 mEq/L (ref 19–32)
GFR calc Af Amer: >90 mL/min (ref >90)
GFR calc non Af Amer: 83 mL/min — ABNORMAL LOW (ref >90)
Glucose, Bld: 100 mg/dL — ABNORMAL HIGH (ref 70–99)
Potassium: 3.7 mEq/L (ref 3.5–5.1)
Sodium: 140 mEq/L (ref 135–145)
Total Protein: 7.2 g/dL (ref 6.0–8.3)

## 2011-08-17 LAB — CBC
Platelets: 290 10*3/uL (ref 150–400)
RBC: 4.02 MIL/uL (ref 3.87–5.11)
RDW: 16.7 % — ABNORMAL HIGH (ref 11.5–15.5)
WBC: 10.6 10*3/uL — ABNORMAL HIGH (ref 4.0–10.5)

## 2011-08-17 MED ORDER — HYDROCODONE-ACETAMINOPHEN 5-325 MG PO TABS: 1.0000 | ORAL_TABLET | Freq: Four times a day (QID) | ORAL | Status: AC | PRN

## 2011-08-17 MED ORDER — PROMETHAZINE HCL 25 MG PO TABS: 25.0000 mg | ORAL_TABLET | Freq: Four times a day (QID) | ORAL | Status: DC | PRN

## 2011-08-17 MED ORDER — ONDANSETRON HCL 4 MG/2ML IJ SOLN
4.0000 mg | Freq: Once | INTRAMUSCULAR | Status: AC
  Administered 2011-08-17: 4 mg via INTRAVENOUS
  Filled 2011-08-17: qty 2

## 2011-08-17 NOTE — ED Notes (Signed)
MD ordered fentanyl for pain, but pt is not able to secure a ride home. Fentanyl held.

## 2011-08-18 ENCOUNTER — Ambulatory Visit (INDEPENDENT_AMBULATORY_CARE_PROVIDER_SITE_OTHER): Payer: Self-pay | Admitting: *Deleted

## 2011-08-18 ENCOUNTER — Other Ambulatory Visit: Payer: Self-pay | Admitting: Obstetrics and Gynecology

## 2011-08-18 ENCOUNTER — Ambulatory Visit (HOSPITAL_COMMUNITY): Payer: Self-pay | Attending: Obstetrics & Gynecology

## 2011-08-18 VITALS — BP 130/84 | HR 68 | Temp 98.6°F | Resp 18 | Ht 71.0 in | Wt 174.8 lb

## 2011-08-18 DIAGNOSIS — N63 Unspecified lump in unspecified breast: Secondary | ICD-10-CM

## 2011-08-18 DIAGNOSIS — Z01419 Encounter for gynecological examination (general) (routine) without abnormal findings: Secondary | ICD-10-CM

## 2011-08-18 NOTE — Progress Notes (Signed)
Complaints of lump in right breast that has increased in size.  Pap Smear:    Pap smear completed today. Patients last Pap smear was 4-5 years ago and normal per patient. Per patient no history of abnormal Pap smears. No Pap smear results are in EPIC.  Physical exam: Breasts Breasts symmetrical. No skin abnormalities left breast. Skin right breast where lump is palpated has the peau d'orange appearance. No nipple retraction bilateral breasts. No nipple discharge bilateral breasts. No lymphadenopathy. No lumps palpated left breast. Palpated a large firm lump in the right outer breast at 11 o'clock measuring around 4 cm in length and 8 cm in width. Complaints of tenderness on palpation of right breast lump. Patient stated first felt lump in July 2012 and lump has been increasing in size since. Patient referred to [facility] for a bilateral diagnostic mammogram and right breast ultrasound. Appointment scheduled for Tuesday, August 22, 2011 at 1040.           Pelvic/Bimanual   Ext Genitalia No lesions, no swelling and no discharge observed on external genitalia.         Vagina Vagina pink and normal texture. No lesions observed in vagina. Brown mucous discharge in vagina.          Cervix Cervix is present. Cervix pink and of normal texture. Small amount of brown mucous discharge observed at cervical os.    Uterus Uterus is present and palpable. Uterus in normal position and normal size.        Adnexae Bilateral ovaries present and palpable. No tenderness on palpation.          Rectovaginal No rectal exam completed today since patient had no rectal complaints. No skin abnormalities observed on exam.

## 2011-08-18 NOTE — Patient Instructions (Signed)
Taught patient how to perform BSE and gave educational materials to take home. Let her know [facility] will cover Pap smears every 3 years unless has a history of abnormal Pap smears.Told patient about the [facility] offers if would like a Pap smear at 2 years.  Patient is scheduled for a bilateral diagnostic mammogram next Tuesday, August 22, 2011 at 1040. Patient aware of appointment and will be there. Let patient know will follow up with her within the next couple weeks with results. Patient verbalized understanding.

## 2011-09-01 ENCOUNTER — Encounter: Payer: Self-pay | Admitting: Obstetrics and Gynecology

## 2011-09-04 ENCOUNTER — Ambulatory Visit
Admission: RE | Admit: 2011-09-04 | Discharge: 2011-09-04 | Disposition: A | Discharge status: Home / Self Care | Payer: No Typology Code available for payment source | Source: Ambulatory Visit | Attending: Obstetrics and Gynecology | Admitting: Obstetrics and Gynecology

## 2011-09-04 ENCOUNTER — Other Ambulatory Visit: Payer: Self-pay | Admitting: Obstetrics and Gynecology

## 2011-09-04 DIAGNOSIS — N63 Unspecified lump in unspecified breast: Secondary | ICD-10-CM

## 2011-09-07 ENCOUNTER — Telehealth: Payer: Self-pay | Admitting: *Deleted

## 2011-09-07 NOTE — Telephone Encounter (Signed)
Attempted to call patient to give results from [facility] clinic. No one answered phone and no voicemail picked up. Unable to leave message. Will call patient back at a later time.

## 2011-09-13 ENCOUNTER — Telehealth: Payer: Self-pay | Admitting: *Deleted

## 2011-09-13 NOTE — Telephone Encounter (Signed)
Attempted to call patient to follow up with [facility] clinic results. Received message that patient not excepting phone calls at this time and no voicemail picked up. Unable to reach patient by phone. Will send patient certified letter.

## 2011-09-14 ENCOUNTER — Encounter: Payer: Self-pay | Admitting: Obstetrics and Gynecology

## 2011-11-03 ENCOUNTER — Encounter (HOSPITAL_BASED_OUTPATIENT_CLINIC_OR_DEPARTMENT_OTHER): Payer: Self-pay | Admitting: *Deleted

## 2011-11-03 ENCOUNTER — Emergency Department (HOSPITAL_BASED_OUTPATIENT_CLINIC_OR_DEPARTMENT_OTHER)
Admission: EM | Admit: 2011-11-03 | Discharge: 2011-11-03 | Disposition: A | Discharge status: Home / Self Care | Payer: Self-pay | Attending: Emergency Medicine | Admitting: Emergency Medicine

## 2011-11-03 DIAGNOSIS — R112 Nausea with vomiting, unspecified: Secondary | ICD-10-CM | POA: Insufficient documentation

## 2011-11-03 DIAGNOSIS — K219 Gastro-esophageal reflux disease without esophagitis: Secondary | ICD-10-CM | POA: Insufficient documentation

## 2011-11-03 DIAGNOSIS — R109 Unspecified abdominal pain: Secondary | ICD-10-CM | POA: Insufficient documentation

## 2011-11-03 LAB — DIFFERENTIAL
Basophils Relative: 0 % (ref 0–1)
Eosinophils Absolute: 0 10*3/uL (ref 0.0–0.7)
Monocytes Absolute: 0.5 10*3/uL (ref 0.1–1.0)
Monocytes Relative: 4 % (ref 3–12)
Neutro Abs: 9.8 10*3/uL — ABNORMAL HIGH (ref 1.7–7.7)

## 2011-11-03 LAB — CBC
HCT: 32.2 % — ABNORMAL LOW (ref 36.0–46.0)
Hemoglobin: 10.5 g/dL — ABNORMAL LOW (ref 12.0–15.0)
MCH: 26.6 pg (ref 26.0–34.0)
MCHC: 32.6 g/dL (ref 30.0–36.0)

## 2011-11-03 LAB — LIPASE, BLOOD: Lipase: 45 U/L (ref 11–59)

## 2011-11-03 LAB — COMPREHENSIVE METABOLIC PANEL
Albumin: 4.1 g/dL (ref 3.5–5.2)
BUN: 7 mg/dL (ref 6–23)
Chloride: 106 mEq/L (ref 96–112)
Creatinine, Ser: 0.6 mg/dL (ref 0.50–1.10)
GFR calc Af Amer: >90 mL/min (ref >90)
Total Bilirubin: 0.5 mg/dL (ref 0.3–1.2)

## 2011-11-03 MED ORDER — DIPHENHYDRAMINE HCL 50 MG/ML IJ SOLN
25.0000 mg | Freq: Once | INTRAMUSCULAR | Status: AC
  Administered 2011-11-03: 25 mg via INTRAVENOUS
  Filled 2011-11-03: qty 1

## 2011-11-03 MED ORDER — HYDROMORPHONE HCL PF 1 MG/ML IJ SOLN
1.0000 mg | Freq: Once | INTRAMUSCULAR | Status: AC
  Administered 2011-11-03: 1 mg via INTRAVENOUS
  Filled 2011-11-03: qty 1

## 2011-11-03 MED ORDER — METOCLOPRAMIDE HCL 10 MG PO TABS: 10.0000 mg | ORAL_TABLET | Freq: Four times a day (QID) | ORAL | Status: DC | PRN

## 2011-11-03 MED ORDER — PROMETHAZINE HCL 25 MG RE SUPP: 25.0000 mg | Freq: Four times a day (QID) | RECTAL | Status: DC | PRN

## 2011-11-03 MED ORDER — SODIUM CHLORIDE 0.9 % IV SOLN
Freq: Once | INTRAVENOUS | Status: AC
  Administered 2011-11-03: 09:00:00 via INTRAVENOUS

## 2011-11-03 MED ORDER — SODIUM CHLORIDE 0.9 % IV BOLUS (SEPSIS)
1000.0000 mL | Freq: Once | INTRAVENOUS | Status: AC
  Administered 2011-11-03: 1000 mL via INTRAVENOUS

## 2011-11-03 MED ORDER — ONDANSETRON HCL 4 MG/2ML IJ SOLN
4.0000 mg | Freq: Once | INTRAMUSCULAR | Status: AC
  Administered 2011-11-03: 4 mg via INTRAVENOUS
  Filled 2011-11-03: qty 2

## 2011-11-03 MED ORDER — METOCLOPRAMIDE HCL 5 MG/ML IJ SOLN
10.0000 mg | Freq: Once | INTRAMUSCULAR | Status: AC
  Administered 2011-11-03: 10 mg via INTRAVENOUS
  Filled 2011-11-03: qty 2

## 2011-11-03 NOTE — ED Notes (Signed)
Pt states that she woke this am with abd pain and vomiting states that she has had issues with her stomach x 7 years states that she has been under a lot of stress and that she worries herself to the point of upset stomach denies urinary sx, fever or diarrhea

## 2011-11-03 NOTE — ED Notes (Signed)
labwork attempted by this rn and kelly king, emt. Unable to obtain labs, dr. Preston Fleeting aware. Approves arterial stick for lab draw, rt made aware.

## 2011-11-03 NOTE — ED Notes (Signed)
Arterial stick for labs was un-successful. Rn, Amy was able to obtain blood for labs via a venous stick. Pt tolerated procedure well.

## 2011-11-03 NOTE — Discharge Instructions (Signed)
Nausea and Vomiting Nausea is a sick feeling that often comes before throwing up (vomiting). Vomiting is a reflex where stomach contents come out of your mouth. Vomiting can cause severe loss of body fluids (dehydration). Children and elderly adults can become dehydrated quickly, especially if they also have diarrhea. Nausea and vomiting are symptoms of a condition or disease. It is important to find the cause of your symptoms. CAUSES   Direct irritation of the stomach lining. This irritation can result from increased acid production (gastroesophageal reflux disease), infection, food poisoning, taking certain medicines (such as nonsteroidal anti-inflammatory drugs), alcohol use, or tobacco use.   Signals from the brain.These signals could be caused by a headache, heat exposure, an inner ear disturbance, increased pressure in the brain from injury, infection, a tumor, or a concussion, pain, emotional stimulus, or metabolic problems.   An obstruction in the gastrointestinal tract (bowel obstruction).   Illnesses such as diabetes, hepatitis, gallbladder problems, appendicitis, kidney problems, cancer, sepsis, atypical symptoms of a heart attack, or eating disorders.   Medical treatments such as chemotherapy and radiation.   Receiving medicine that makes you sleep (general anesthetic) during surgery.  DIAGNOSIS Your caregiver may ask for tests to be done if the problems do not improve after a few days. Tests may also be done if symptoms are severe or if the reason for the nausea and vomiting is not clear. Tests may include:  Urine tests.   Blood tests.   Stool tests.   Cultures (to look for evidence of infection).   X-rays or other imaging studies.  Test results can help your caregiver make decisions about treatment or the need for additional tests. TREATMENT You need to stay well hydrated. Drink frequently but in small amounts.You may wish to drink water, sports drinks, clear broth, or  eat frozen ice pops or gelatin dessert to help stay hydrated.When you eat, eating slowly may help prevent nausea.There are also some antinausea medicines that may help prevent nausea. HOME CARE INSTRUCTIONS   Take all medicine as directed by your caregiver.   If you do not have an appetite, do not force yourself to eat. However, you must continue to drink fluids.   If you have an appetite, eat a normal diet unless your caregiver tells you differently.   Eat a variety of complex carbohydrates (rice, wheat, potatoes, bread), lean meats, yogurt, fruits, and vegetables.   Avoid high-fat foods because they are more difficult to digest.   Drink enough water and fluids to keep your urine clear or pale yellow.   If you are dehydrated, ask your caregiver for specific rehydration instructions. Signs of dehydration may include:   Severe thirst.   Dry lips and mouth.   Dizziness.   Dark urine.   Decreasing urine frequency and amount.   Confusion.   Rapid breathing or pulse.  SEEK IMMEDIATE MEDICAL CARE IF:   You have blood or brown flecks (like coffee grounds) in your vomit.   You have black or bloody stools.   You have a severe headache or stiff neck.   You are confused.   You have severe abdominal pain.   You have chest pain or trouble breathing.   You do not urinate at least once every 8 hours.   You develop cold or clammy skin.   You continue to vomit for longer than 24 to 48 hours.   You have a fever.  MAKE SURE YOU:   Understand these instructions.   Will watch your  condition.   Will get help right away if you are not doing well or get worse.  Document Released: 07/03/2005 Document Revised: 06/22/2011 Document Reviewed: 11/30/2010 [facility] Patient Information 2012 [place], [place].  Cyclic Vomiting Syndrome Cyclic vomiting syndrome (CVS) is a benign condition in which patients experience bouts or cycles of severe nausea and vomiting that last for hours or even  days. The bouts of nausea and vomiting alternate with longer periods of no symptoms and generally good health. CVS occurs mostly in children, but can affect adults. CVS has no known cause. Each episode is typically similar to the previous ones. The episodes tend to:   Start at about the same time of day.   Last the same length of time.   Present the same symptoms at the same level of intensity.  CVS can begin at any age in children and adults. CVS usually starts between the ages of 15 and 66. In adults, episodes tend to occur less often than they do in children, but they last longer. Furthermore, the events or situations that trigger episodes in adults cannot always be pinpointed as easily as they can in children. THE FOUR PHASES OF CVS 1. Prodrome.  2. Episode.  3. Recovery.  4. Symptom-free interval.  The prodrome phase signals that an episode of nausea and vomiting is about to begin. This phase can last from just a few minutes to several hours. This phase is often marked by belly (abdominal) pain. Sometimes taking medicine early in the prodrome phase can stop an episode in progress. However, sometimes there is no warning. A person may simply wake up in the middle of the night or early morning and begin vomiting. The episode phase consists of:  Severe vomiting.   Nausea.   Gagging (retching).  The recovery phase begins when the nausea and vomiting stop. Healthy color, appetite, and energy return. The symptom-free interval phase is the period between episodes when no symptoms are present. TRIGGERS Episodes can be triggered by an infection or event. Examples of triggers include:  Infections.   Colds, allergies, sinus problems, and the flu.   Eating certain foods such as chocolate or cheese.   Foods with MSG or preservatives.   Fast foods.   Pre-packaged foods.   Foods with low nutritional value (junk foods).   Overeating.   Eating just before going to bed.   Hot weather.    Dehydration.   Not enough sleep or poor sleep quality.   Physical exhaustion.   Menstruation.   Motion sickness.   Emotional stress (school or home difficulties).   Excitement or stress.  SYMPTOMS  The main symptoms of CVS are:  Severe vomiting.   Nausea.   Gagging (retching).  Episodes usually begin at night or the first thing in the morning. Episodes may include vomiting or retching up to 5 or 6 times an hour during the worst of the episode. Episodes usually last anywhere from 1 to 4 days. Episodes can last for up to 10 days. Other symptoms include:  Paleness.   Exhaustion.   Listlessness.   Abdominal pain.   Loose stools or diarrhea.  Sometimes the nausea and vomiting are so severe that a person appears to be almost unconscious. Sensitivity to light, headache, fever, dizziness, may also accompany an episode. In addition, the vomiting may cause drooling and excessive thirst. Drinking water usually leads to more vomiting, though the water can dilute the acid in the vomit, making the episode a little less painful. Continuous  vomiting can lead to dehydration, which means that the body has lost excessive water and salts. DIAGNOSIS  CVS is hard to diagnose because there are no clear tests to identify it. A caregiver must diagnose CVS by looking at symptoms and medical history. A caregiver must exclude more common diseases or disorders that can also cause nausea and vomiting. Also, diagnosis takes time because caregivers need to identify a pattern or cycle to the vomiting. CVS AND MIGRAINE The relationship between migraine and CVS is still unclear. Medical researchers believe that they are related for 3 reasons: 1. Migraine headaches (which cause severe pain in the head), abdominal migraine (which causes stomach pain), and CVS are all marked by severe symptoms that start quickly and end abruptly, followed by longer periods without pain or other symptoms.  2. Many of the  situations that trigger CVS also trigger migraines. Those triggers include stress and excitement.  3. Research has shown that many children with CVS either have a family history of migraine or develop migraines as they grow older.  Because of the similarities between migraine and CVS, caregivers treat some people with severe CVS with drugs that are also used for migraine headaches. The drugs are designed to:  Prevent episodes.   Reduce their frequency.   Lessen their severity.  TREATMENT  CVS cannot be cured. Treatment varies, but people with CVS should get plenty of rest and sleep and take medications that prevent, stop, or lessen the vomiting episodes and other symptoms. Once a vomiting episode begins, treatment is supportive. It helps to stay in bed and sleep in a dark, quiet room. Severe nausea and vomiting may require hospitalization and intravenous (IV) fluids to prevent dehydration. Relaxing medications (sedatives) may help if the nausea continues. Sometimes, during the prodrome phase, it is possible to stop an episode from happening altogether. Only take over-the-counter or prescription medicines for pain, discomfort or fever as directed by your caregiver. Do not give aspirin to children. During the recovery phase, drinking water and replacing lost electrolytes (salts in the blood) are very important. Electrolytes are salts that the body needs to function well and stay healthy. Symptoms during the recovery phase can vary. Some people find that their appetites return to normal immediately, while others need to begin by drinking clear liquids and then move slowly to solid food. People whose episodes are frequent and long-lasting may be treated during the symptom-free intervals in an effort to prevent or ease future episodes. Medications that help people with migraine headaches are sometimes used during this phase, but they do not work for everyone. Taking the medicine daily for 1 to 2 months may  be necessary to see if it helps. The symptom-free phase is a good time to eliminate anything known to trigger an episode. For example, if episodes are brought on by stress or excitement, this period is the time to find ways to reduce stress and stay calm. If sinus problems or allergies cause episodes, those conditions should be treated. The triggers listed above should be avoided or prevented. RELATED COMPLICATIONS The severe vomiting that defines CVS is a risk factor for several complications:  Dehydration. Vomiting causes the body to lose water quickly.   Electrolyte imbalance. Vomiting also causes the body to lose the important salts it needs to keep working properly.   Peptic esophagitis. The tube that connects the mouth to the stomach (esophagus) becomes injured from the stomach acid that comes up with the vomit.   Hematemesis. The esophagus becomes  irritated and bleeds, so blood mixes with the vomit.   Mallory-Weiss tear. The lower end of the esophagus may tear open or the stomach may bruise from vomiting or retching.   Tooth decay. The acid in the vomit can hurt the teeth by corroding the tooth enamel.  SEEK MEDICAL CARE IF: You have questions or problems. Document Released: 09/11/2001 Document Revised: 06/22/2011 Document Reviewed: 10/10/2010 [facility] Patient Information 2012 [place], [place].  Promethazine suppositories What is this medicine? PROMETHAZINE (proe METH a zeen) is an antihistamine. It is used to treat allergic reactions and to treat or prevent nausea and vomiting from illness or motion sickness. It is also used to make you sleep before surgery, and to help treat pain or nausea after surgery. This medicine may be used for other purposes; ask your health care provider or pharmacist if you have questions. What should I tell my health care provider before I take this medicine? They need to know if you have any of these conditions: -glaucoma -high blood pressure or heart  disease -kidney disease -liver disease -lung or breathing disease, like asthma -prostate trouble -pain or difficulty passing urine -seizures -an unusual or allergic reaction to promethazine or phenothiazines, other medicines, foods, dyes, or preservatives -pregnant or trying to get pregnant -breast-feeding How should I use this medicine? This medicine is for rectal use only. Do not take by mouth. Wash your hands before and after use. Take off the foil wrapping. Wet the tip of the suppository with cold tap water to make it easier to use. Lie on your side with your lower leg straightened out and your upper leg bent forward toward your stomach. Lift upper buttock to expose the rectal area. Apply gentle pressure to insert the suppository completely into the rectum, pointed end first. Hold buttocks together for a few seconds. Remain lying down for about 15 minutes to avoid having the suppository come out. Do not use more often than directed. Talk to your pediatrician regarding the use of this medicine in children. Special care may be needed. This medicine should not be given to infants and children younger than 95 years old. Overdosage: If you think you have taken too much of this medicine contact a poison control center or emergency room at once. NOTE: This medicine is only for you. Do not share this medicine with others. What if I miss a dose? If you miss a dose, use it as soon as you can. If it is almost time for your next dose, use only that dose. Do not use double doses. What may interact with this medicine? Do not take this medicine with any of the following medications: -medicines called MAO Inhibitors like Nardil, Parnate, Marplan, Eldepryl -other phenothiazines like trimethobenzamide This medicine may also interact with the following medications: -barbiturates such as phenobarbital -bromocriptine -certain antidepressants -certain antihistamines used in allergy or cold  medicines -epinephrine -levodopa -medicines for sleep -medicines for mental problems and psychotic disturbances -medicines for movement abnormalities as in Parkinson's disease, or for gastrointestinal problems -muscle relaxants -prescription pain medicines This list may not describe all possible interactions. Give your health care provider a list of all the medicines, herbs, non-prescription drugs, or dietary supplements you use. Also tell them if you smoke, drink alcohol, or use illegal drugs. Some items may interact with your medicine. What should I watch for while using this medicine? Tell your doctor or health care professional if your symptoms do not start to get better in 1 to 2 days. You may  get drowsy or dizzy. Do not drive, use machinery, or do anything that needs mental alertness until you know how this medicine affects you. To reduce the risk of dizzy or fainting spells, do not stand or sit up quickly, especially if you are an older patient. Alcohol may increase dizziness and drowsiness. Avoid alcoholic drinks. Your mouth may get dry. Chewing sugarless gum or sucking hard candy, and drinking plenty of water may help. Contact your doctor if the problem does not go away or is severe. This medicine may cause dry eyes and blurred vision. If you wear contact lenses you may feel some discomfort. Lubricating drops may help. See your eye doctor if the problem does not go away or is severe. This medicine can make you more sensitive to the sun. Keep out of the sun. If you cannot avoid being in the sun, wear protective clothing and use sunscreen. Do not use sun lamps or tanning beds/booths. If you are diabetic, check your blood-sugar levels regularly. What side effects may I notice from receiving this medicine? Side effects that you should report to your doctor or health care professional as soon as possible: -blurred vision -irregular heartbeat, palpitations or chest pain -muscle or facial  twitches -pain or difficulty passing urine -seizures -skin rash -slowed or shallow breathing -unusual bleeding or bruising -yellowing of the eyes or skin Side effects that usually do not require medical attention (report to your doctor or health care professional if they continue or are bothersome): -headache -nightmares, agitation, nervousness, excitability, not able to sleep (these are more likely in children) -stuffy nose This list may not describe all possible side effects. Call your doctor for medical advice about side effects. You may report side effects to FDA at 1-800-FDA-1088. Where should I keep my medicine? Keep out of the reach of children. Store in a refrigerator between 2 and 8 degrees C (36 and 46 degrees F). Throw away any unused medicine after the expiration date. NOTE: This sheet is a summary. It may not cover all possible information. If you have questions about this medicine, talk to your doctor, pharmacist, or health care provider.  2012, [employer]. (01/30/2008 12:07:43 PM)  Metoclopramide tablets What is this medicine? METOCLOPRAMIDE (met oh kloe PRA mide) is used to treat the symptoms of gastroesophageal reflux disease (GERD) like heartburn. It is also used to treat people with slow emptying of the stomach and intestinal tract. This medicine may be used for other purposes; ask your health care provider or pharmacist if you have questions. What should I tell my health care provider before I take this medicine? They need to know if you have any of these conditions: -breast cancer -depression -diabetes -heart failure -high blood pressure -kidney disease -liver disease -Parkinson's disease or a movement disorder -pheochromocytoma -seizures -stomach obstruction, bleeding, or perforation -an unusual or allergic reaction to metoclopramide, procainamide, sulfites, other medicines, foods, dyes, or preservatives -pregnant or trying to get  pregnant -breast-feeding How should I use this medicine? Take this medicine by mouth with a glass of water. Follow the directions on the prescription label. Take this medicine on an empty stomach, about 30 minutes before eating. Take your doses at regular intervals. Do not take your medicine more often than directed. Do not stop taking except on the advice of your doctor or health care professional. A special MedGuide will be given to you by the pharmacist with each prescription and refill. Be sure to read this information carefully each time. Talk to your  pediatrician regarding the use of this medicine in children. Special care may be needed. Overdosage: If you think you have taken too much of this medicine contact a poison control center or emergency room at once. NOTE: This medicine is only for you. Do not share this medicine with others. What if I miss a dose? If you miss a dose, take it as soon as you can. If it is almost time for your next dose, take only that dose. Do not take double or extra doses. What may interact with this medicine? -acetaminophen -cyclosporine -digoxin -medicines for blood pressure -medicines for diabetes, including insulin -medicines for hay fever and other allergies -medicines for depression, especially an Monoamine Oxidase Inhibitor (MAOI) -medicines for Parkinson's disease, like levodopa -medicines for sleep or for pain -tetracycline This list may not describe all possible interactions. Give your health care provider a list of all the medicines, herbs, non-prescription drugs, or dietary supplements you use. Also tell them if you smoke, drink alcohol, or use illegal drugs. Some items may interact with your medicine. What should I watch for while using this medicine? It may take a few weeks for your stomach condition to start to get better. However, do not take this medicine for longer than 12 weeks. The longer you take this medicine, and the more you take it,  the greater your chances are of developing serious side effects. If you are an elderly patient, a female patient, or you have diabetes, you may be at an increased risk for side effects from this medicine. Contact your doctor immediately if you start having movements you cannot control such as lip smacking, rapid movements of the tongue, involuntary or uncontrollable movements of the eyes, head, arms and legs, or muscle twitches and spasms. Patients and their families should watch out for worsening depression or thoughts of suicide. Also watch out for any sudden or severe changes in feelings such as feeling anxious, agitated, panicky, irritable, hostile, aggressive, impulsive, severely restless, overly excited and hyperactive, or not being able to sleep. If this happens, especially at the beginning of treatment or after a change in dose, call your doctor. Do not treat yourself for high fever. Ask your doctor or health care professional for advice. You may get drowsy or dizzy. Do not drive, use machinery, or do anything that needs mental alertness until you know how this drug affects you. Do not stand or sit up quickly, especially if you are an older patient. This reduces the risk of dizzy or fainting spells. Alcohol can make you more drowsy and dizzy. Avoid alcoholic drinks. What side effects may I notice from receiving this medicine? Side effects that you should report to your doctor or health care professional as soon as possible: -allergic reactions like skin rash, itching or hives, swelling of the face, lips, or tongue -abnormal production of milk in females -breast enlargement in both males and females -change in the way you walk -difficulty moving, speaking or swallowing -drooling, lip smacking, or rapid movements of the tongue -excessive sweating -fever -involuntary or uncontrollable movements of the eyes, head, arms and legs -irregular heartbeat or palpitations -muscle twitches and  spasms -unusually weak or tired Side effects that usually do not require medical attention (report to your doctor or health care professional if they continue or are bothersome): -change in sex drive or performance -depressed mood -diarrhea -difficulty sleeping -headache -menstrual changes -restless or nervous This list may not describe all possible side effects. Call your doctor for medical advice  about side effects. You may report side effects to FDA at 1-800-FDA-1088. Where should I keep my medicine? Keep out of the reach of children. Store at room temperature between 20 and 25 degrees C (68 and 77 degrees F). Protect from light. Keep container tightly closed. Throw away any unused medicine after the expiration date. NOTE: This sheet is a summary. It may not cover all possible information. If you have questions about this medicine, talk to your doctor, pharmacist, or health care provider.  2012, [employer]. (02/26/2008 4:30:05 PM)

## 2011-11-03 NOTE — ED Provider Notes (Signed)
History     CSN: [id number]  Arrival date & time 11/03/11  7829   First MD Initiated Contact with Patient 11/03/11 0710      Chief Complaint  Patient presents with  . Emesis  . Abdominal Pain    (Consider location/radiation/quality/duration/timing/severity/associated sxs/prior treatment) Patient is a 36 y.o. female presenting with vomiting and abdominal pain. The history is provided by the patient.  Emesis  Associated symptoms include abdominal pain.  Abdominal Pain The primary symptoms of the illness include abdominal pain and vomiting.  She has been having problems with recurrent episodes of abdominal pain and vomiting which have been investigated extensively. Last night, she started having similar episode with epigastric pain and nausea and vomiting. Pain doesn't radiate but is severe. She rates at 10/10. It is worse when she vomits but nothing makes it any better. She denies constipation or diarrhea denies fever, chills, sweats. This episode is similar to all of her previous episodes. She tried using a Phenergan suppository but it was not effective.  Past Medical History  Diagnosis Date  . GERD (gastroesophageal reflux disease)   . History of migraine     History reviewed. No pertinent past surgical history.  Family History  Problem Relation Age of Onset  . Migraines Mother   . Diabetes Father     History  Substance Use Topics  . Smoking status: Current Everyday Smoker -- 0.5 packs/day    Types: Cigarettes  . Smokeless tobacco: Not on file  . Alcohol Use: No     occasional    OB History    Grav Para Term Preterm Abortions TAB SAB Ect Mult Living   3    3  1 2         Review of Systems  Gastrointestinal: Positive for vomiting and abdominal pain.  All other systems reviewed and are negative.    Allergies  Ciprofloxacin  Home Medications   Current Outpatient Rx  Name Route Sig Dispense Refill  . OMEPRAZOLE MAGNESIUM 20 MG PO TBEC Oral Take 20 mg by  mouth daily.        BP 159/119  Pulse 64  Temp(Src) 98.5 F (36.9 C) (Oral)  Resp 20  SpO2 100%  Physical Exam  Nursing note and vitals reviewed.  36 year old female appears uncomfortable and in pain. Vital signs are significant for hypertension with blood pressure 159/119. Oxygen saturation is 100% which is normal. Head is normocephalic and atraumatic. PERRLA, EOMI. There is no scleral icterus. Oropharynx shows mucus hemorrhages slightly dry. Neck is nontender and supple. Back is nontender. Lungs are clear without rales, wheezes, or rhonchi. Heart has regular rate and rhythm without murmur. Abdomen is soft, flat, with moderate epigastric tenderness. No rebound or guarding and no hepatosplenomegaly. Extremities have full range of motion, no cyanosis or edema. Skin is warm and dry without rash. Neurologic: Mental status is normal, cranial nerves are intact, there are no focal motor or sensory deficits.  ED Course  Procedures (including critical care time)  Results for orders placed during the hospital encounter of 11/03/11  CBC      Component Value Range   WBC 12.0 (*) 4.0 - 10.5 (K/uL)   RBC 3.95  3.87 - 5.11 (MIL/uL)   Hemoglobin 10.5 (*) 12.0 - 15.0 (g/dL)   HCT 56.2 (*) 13.0 - 46.0 (%)   MCV 81.5  78.0 - 100.0 (fL)   MCH 26.6  26.0 - 34.0 (pg)   MCHC 32.6  30.0 - 36.0 (g/dL)  RDW 15.4  11.5 - 15.5 (%)   Platelets 325  150 - 400 (K/uL)  DIFFERENTIAL      Component Value Range   Neutrophils Relative 82 (*) 43 - 77 (%)   Neutro Abs 9.8 (*) 1.7 - 7.7 (K/uL)   Lymphocytes Relative 14  12 - 46 (%)   Lymphs Abs 1.6  0.7 - 4.0 (K/uL)   Monocytes Relative 4  3 - 12 (%)   Monocytes Absolute 0.5  0.1 - 1.0 (K/uL)   Eosinophils Relative 0  0 - 5 (%)   Eosinophils Absolute 0.0  0.0 - 0.7 (K/uL)   Basophils Relative 0  0 - 1 (%)   Basophils Absolute 0.0  0.0 - 0.1 (K/uL)  COMPREHENSIVE METABOLIC PANEL      Component Value Range   Sodium 139  135 - 145 (mEq/L)   Potassium 3.7  3.5 -  5.1 (mEq/L)   Chloride 106  96 - 112 (mEq/L)   CO2 24  19 - 32 (mEq/L)   Glucose, Bld 107 (*) 70 - 99 (mg/dL)   BUN 7  6 - 23 (mg/dL)   Creatinine, Ser 4.09  0.50 - 1.10 (mg/dL)   Calcium 8.8  8.4 - 81.1 (mg/dL)   Total Protein 7.3  6.0 - 8.3 (g/dL)   Albumin 4.1  3.5 - 5.2 (g/dL)   AST 15  0 - 37 (U/L)   ALT 8  0 - 35 (U/L)   Alkaline Phosphatase 71  39 - 117 (U/L)   Total Bilirubin 0.5  0.3 - 1.2 (mg/dL)   GFR calc non Af Amer >90  >90 (mL/min)   GFR calc Af Amer >90  >90 (mL/min)  LIPASE, BLOOD      Component Value Range   Lipase 45  11 - 59 (U/L)   She got no relief from ondansetron but got good relief of nausea with metoclopramide. Pain was well controlled after a dose of hydromorphone. Her symptom pattern seems to fit best with cyclical vomiting syndrome and this was discussed with the patient. She states she is trying to get a referral to the gastroenterology clinic at [facility]. She is sent home with a prescription for promethazine suppository. However, it is noted that she seems to get that her relief with metoclopramide today and at her last ED visit so she is given a prescription for metoclopramide tablets as well.  No diagnosis found.    MDM  Recurrent episodes of abdominal pain and vomiting of uncertain cause. Old records have been reviewed and she has several ED visits of with similar presentations which are always treated symptomatically. She'll be given IV hydration and IV antiemetics and analgesics.        Dione Booze, MD 11/03/11 1007

## 2012-03-06 ENCOUNTER — Encounter (HOSPITAL_BASED_OUTPATIENT_CLINIC_OR_DEPARTMENT_OTHER): Payer: Self-pay | Admitting: Emergency Medicine

## 2012-03-06 ENCOUNTER — Emergency Department (HOSPITAL_BASED_OUTPATIENT_CLINIC_OR_DEPARTMENT_OTHER)
Admission: EM | Admit: 2012-03-06 | Discharge: 2012-03-06 | Disposition: A | Discharge status: Home / Self Care | Payer: Self-pay | Attending: Emergency Medicine | Admitting: Emergency Medicine

## 2012-03-06 DIAGNOSIS — F17200 Nicotine dependence, unspecified, uncomplicated: Secondary | ICD-10-CM | POA: Insufficient documentation

## 2012-03-06 DIAGNOSIS — K219 Gastro-esophageal reflux disease without esophagitis: Secondary | ICD-10-CM | POA: Insufficient documentation

## 2012-03-06 DIAGNOSIS — R112 Nausea with vomiting, unspecified: Secondary | ICD-10-CM | POA: Insufficient documentation

## 2012-03-06 LAB — URINE MICROSCOPIC-ADD ON

## 2012-03-06 LAB — URINALYSIS, ROUTINE W REFLEX MICROSCOPIC
Ketones, ur: 15 mg/dL — AB
Leukocytes, UA: NEGATIVE
Nitrite: NEGATIVE
Protein, ur: NEGATIVE mg/dL
Urobilinogen, UA: 0.2 mg/dL (ref 0.0–1.0)
pH: 8 (ref 5.0–8.0)

## 2012-03-06 MED ORDER — DIPHENHYDRAMINE HCL 50 MG/ML IJ SOLN: 25.0000 mg | Freq: Once | INTRAMUSCULAR | Status: DC

## 2012-03-06 MED ORDER — METOCLOPRAMIDE HCL 5 MG/ML IJ SOLN
10.0000 mg | Freq: Once | INTRAMUSCULAR | Status: AC
  Administered 2012-03-06: 10 mg via INTRAMUSCULAR
  Filled 2012-03-06: qty 2

## 2012-03-06 MED ORDER — SODIUM CHLORIDE 0.9 % IV BOLUS (SEPSIS): 1000.0000 mL | Freq: Once | INTRAVENOUS | Status: DC

## 2012-03-06 MED ORDER — HYDROMORPHONE HCL PF 1 MG/ML IJ SOLN
1.0000 mg | Freq: Once | INTRAMUSCULAR | Status: AC
  Administered 2012-03-06: 1 mg via INTRAMUSCULAR
  Filled 2012-03-06: qty 1

## 2012-03-06 MED ORDER — METOCLOPRAMIDE HCL 5 MG/ML IJ SOLN: 10.0000 mg | Freq: Once | INTRAMUSCULAR | Status: DC

## 2012-03-06 MED ORDER — PROMETHAZINE HCL 25 MG/ML IJ SOLN
INTRAMUSCULAR | Status: AC
  Administered 2012-03-06: 25 mg via INTRAMUSCULAR
  Filled 2012-03-06: qty 1

## 2012-03-06 MED ORDER — ONDANSETRON 8 MG PO TBDP
ORAL_TABLET | ORAL | Status: AC
  Administered 2012-03-06: 8 mg via ORAL
  Filled 2012-03-06: qty 1

## 2012-03-06 MED ORDER — ONDANSETRON 8 MG PO TBDP
8.0000 mg | ORAL_TABLET | Freq: Once | ORAL | Status: AC
  Administered 2012-03-06: 8 mg via ORAL

## 2012-03-06 MED ORDER — PROMETHAZINE HCL 25 MG/ML IJ SOLN
25.0000 mg | Freq: Once | INTRAMUSCULAR | Status: AC
  Administered 2012-03-06: 25 mg via INTRAMUSCULAR

## 2012-03-06 MED ORDER — HYDROMORPHONE HCL PF 1 MG/ML IJ SOLN: 1.0000 mg | Freq: Once | INTRAMUSCULAR | Status: DC

## 2012-03-06 MED ORDER — PROMETHAZINE HCL 25 MG/ML IJ SOLN
25.0000 mg | Freq: Once | INTRAMUSCULAR | Status: AC
  Administered 2012-03-06: 25 mg via INTRAMUSCULAR
  Filled 2012-03-06: qty 1

## 2012-03-06 MED ORDER — PROMETHAZINE HCL 25 MG/ML IJ SOLN: 25.0000 mg | Freq: Once | INTRAMUSCULAR | Status: DC

## 2012-03-06 MED ORDER — ONDANSETRON HCL 4 MG/2ML IJ SOLN: 4.0000 mg | Freq: Once | INTRAMUSCULAR | Status: DC

## 2012-03-06 MED ORDER — DIPHENHYDRAMINE HCL 50 MG/ML IJ SOLN
50.0000 mg | Freq: Once | INTRAMUSCULAR | Status: AC
  Administered 2012-03-06: 50 mg via INTRAMUSCULAR
  Filled 2012-03-06: qty 1

## 2012-03-06 NOTE — ED Notes (Signed)
Iv access attempted by dr. Rosalia Hammers, right a/c and right EJ, both unsuccessful. Pt tolerated well. 2 attempts to right foot by this rn with #24, unsuccessful. Pt medicated with zofran odt.

## 2012-03-06 NOTE — ED Provider Notes (Signed)
History     CSN: [id number]  Arrival date & time 03/06/12  4098   First MD Initiated Contact with Patient 03/06/12 [phone]      Chief Complaint  Patient presents with  . Hematemesis    (Consider location/radiation/quality/duration/timing/severity/associated sxs/prior treatment) HPI  Patient complaining of vomiting that began at 02 100 this morning. She has a history of chronic nausea and vomiting. She states that she normally uses Phenergan suppositories. She was at work last night when her Phenergan began to wear off and she went to get another one but realized she left at home. She states that she will have intractable vomiting when she is not able to take her Phenergan. She did have some hematemesis which she has had multiple times in the past after multiple episodes of vomiting. She has not had any dark stools or been lightheaded. She states this is like her usual episodes. She has been followed at [facility] recently.  Past Medical History  Diagnosis Date  . GERD (gastroesophageal reflux disease)   . History of migraine     History reviewed. No pertinent past surgical history.  Family History  Problem Relation Age of Onset  . Migraines Mother   . Diabetes Father     History  Substance Use Topics  . Smoking status: Current Everyday Smoker -- 0.5 packs/day    Types: Cigarettes  . Smokeless tobacco: Not on file  . Alcohol Use: No     occasional    OB History    Grav Para Term Preterm Abortions TAB SAB Ect Mult Living   3    3  1 2         Review of Systems  All other systems reviewed and are negative.    Allergies  Ciprofloxacin  Home Medications   Current Outpatient Rx  Name Route Sig Dispense Refill  . METOCLOPRAMIDE HCL 10 MG PO TABS Oral Take 1 tablet (10 mg total) by mouth every 6 (six) hours. 30 tablet 0  . METOCLOPRAMIDE HCL 10 MG PO TABS Oral Take 1 tablet (10 mg total) by mouth every 6 (six) hours as needed (nausea). 30 tablet 0  . OMEPRAZOLE  MAGNESIUM 20 MG PO TBEC Oral Take 20 mg by mouth daily.      Marland Kitchen PROMETHAZINE HCL 25 MG RE SUPP Rectal Place 1 suppository (25 mg total) rectally every 6 (six) hours as needed for nausea. 12 each 0  . PROMETHAZINE HCL 25 MG RE SUPP Rectal Place 1 suppository (25 mg total) rectally every 6 (six) hours as needed for nausea. 12 each 0  . PROMETHAZINE HCL 25 MG PO TABS Oral Take 1 tablet (25 mg total) by mouth every 6 (six) hours as needed for nausea. 15 tablet 0  . PROMETHAZINE HCL 25 MG PO TABS Oral Take 1 tablet (25 mg total) by mouth every 6 (six) hours as needed for nausea. 30 tablet 0    BP 183/122  Pulse 116  Temp 98.5 F (36.9 C) (Oral)  Resp 20  Ht 5\' 11"  (1.803 m)  Wt 160 lb (72.576 kg)  BMI 22.32 kg/m2  SpO2 100%  LMP 02/26/2012  Physical Exam  Nursing note and vitals reviewed. Constitutional: She appears well-developed and well-nourished.       Uncomfortable appearing female with repeated episodes of emesis  HENT:  Head: Normocephalic and atraumatic.  Eyes: Conjunctivae and EOM are normal. Pupils are equal, round, and reactive to light.  Neck: Normal range of motion. Neck supple.  Cardiovascular:  Regular rhythm, normal heart sounds and intact distal pulses.  Tachycardia present.   Pulmonary/Chest: Effort normal and breath sounds normal.  Abdominal: Soft. Bowel sounds are normal.  Musculoskeletal: Normal range of motion.  Neurological: She is alert.  Skin: Skin is warm and dry.  Psychiatric: She has a normal mood and affect. Thought content normal.    ED Course  Procedures (including critical care time)  Labs Reviewed  URINALYSIS, ROUTINE W REFLEX MICROSCOPIC - Abnormal; Notable for the following:    APPearance TURBID (*)     Ketones, ur 15 (*)     All other components within normal limits  URINE MICROSCOPIC-ADD ON - Abnormal; Notable for the following:    Squamous Epithelial / LPF FEW (*)     Bacteria, UA MANY (*)     All other components within normal limits    PREGNANCY, URINE  CBC WITH DIFFERENTIAL  COMPREHENSIVE METABOLIC PANEL  LIPASE, BLOOD   No results found.   No diagnosis found.    MDM  Patient with multiple intravenous access attempts and we were unable to obtain IV access. She received IM medications and has been able to tolerate by mouth. She received a second dose of Phenergan and been tolerating by mouth fluids without vomiting for several hours. She states that she feels greatly improved and would like to go home at this time.       Hilario Quarry, MD 03/06/12 1224

## 2012-03-06 NOTE — ED Notes (Signed)
Dr. Rosalia Hammers aware of inability to obtain iv access and labs.

## 2012-03-06 NOTE — ED Notes (Signed)
Pt given ginger ale.  Tolerated.  Still experiencing nausea but able to keep liquid down.

## 2012-03-06 NOTE — ED Notes (Signed)
Had an EGD at [facility] on 02/26/12

## 2012-03-06 NOTE — ED Notes (Signed)
Difficulty establishing iv access, pt states she has had to have iv and labs from her feet in the past. Unsuccessful x 3 attempts, dr. Rosalia Hammers at bedside for exam. Iv access deferred for md eval.

## 2012-03-06 NOTE — ED Notes (Signed)
Pt began vomiting at 0200 this morning.  Saw blood in the vomit at 0500.

## 2012-07-25 ENCOUNTER — Emergency Department (HOSPITAL_BASED_OUTPATIENT_CLINIC_OR_DEPARTMENT_OTHER): Payer: 59

## 2012-07-25 ENCOUNTER — Encounter (HOSPITAL_BASED_OUTPATIENT_CLINIC_OR_DEPARTMENT_OTHER): Payer: Self-pay | Admitting: *Deleted

## 2012-07-25 ENCOUNTER — Emergency Department (HOSPITAL_BASED_OUTPATIENT_CLINIC_OR_DEPARTMENT_OTHER)
Admission: EM | Admit: 2012-07-25 | Discharge: 2012-07-25 | Disposition: A | Discharge status: Home / Self Care | Payer: 59 | Attending: Emergency Medicine | Admitting: Emergency Medicine

## 2012-07-25 DIAGNOSIS — Z79899 Other long term (current) drug therapy: Secondary | ICD-10-CM | POA: Insufficient documentation

## 2012-07-25 DIAGNOSIS — F17200 Nicotine dependence, unspecified, uncomplicated: Secondary | ICD-10-CM | POA: Insufficient documentation

## 2012-07-25 DIAGNOSIS — Z8679 Personal history of other diseases of the circulatory system: Secondary | ICD-10-CM | POA: Insufficient documentation

## 2012-07-25 DIAGNOSIS — Z3202 Encounter for pregnancy test, result negative: Secondary | ICD-10-CM | POA: Insufficient documentation

## 2012-07-25 DIAGNOSIS — R059 Cough, unspecified: Secondary | ICD-10-CM | POA: Insufficient documentation

## 2012-07-25 DIAGNOSIS — R05 Cough: Secondary | ICD-10-CM | POA: Insufficient documentation

## 2012-07-25 DIAGNOSIS — R1110 Vomiting, unspecified: Secondary | ICD-10-CM | POA: Insufficient documentation

## 2012-07-25 DIAGNOSIS — E876 Hypokalemia: Secondary | ICD-10-CM

## 2012-07-25 DIAGNOSIS — K219 Gastro-esophageal reflux disease without esophagitis: Secondary | ICD-10-CM | POA: Insufficient documentation

## 2012-07-25 DIAGNOSIS — R197 Diarrhea, unspecified: Secondary | ICD-10-CM | POA: Insufficient documentation

## 2012-07-25 LAB — BASIC METABOLIC PANEL
CO2: 17 mEq/L — ABNORMAL LOW (ref 19–32)
Calcium: 5.8 mg/dL — CL (ref 8.4–10.5)
GFR calc non Af Amer: >90 mL/min (ref >90)
Glucose, Bld: 115 mg/dL — ABNORMAL HIGH (ref 70–99)
Potassium: 2.2 mEq/L — CL (ref 3.5–5.1)
Sodium: 143 mEq/L (ref 135–145)

## 2012-07-25 LAB — URINALYSIS, ROUTINE W REFLEX MICROSCOPIC
Glucose, UA: NEGATIVE mg/dL
Ketones, ur: NEGATIVE mg/dL
Leukocytes, UA: NEGATIVE
Nitrite: NEGATIVE
Protein, ur: NEGATIVE mg/dL

## 2012-07-25 LAB — PREGNANCY, URINE: Preg Test, Ur: NEGATIVE

## 2012-07-25 MED ORDER — MORPHINE SULFATE 4 MG/ML IJ SOLN
4.0000 mg | Freq: Once | INTRAMUSCULAR | Status: AC
  Administered 2012-07-25: 4 mg via INTRAVENOUS

## 2012-07-25 MED ORDER — SODIUM CHLORIDE 0.9 % IV BOLUS (SEPSIS)
1000.0000 mL | Freq: Once | INTRAVENOUS | Status: AC
  Administered 2012-07-25: 1000 mL via INTRAVENOUS

## 2012-07-25 MED ORDER — POTASSIUM CHLORIDE 10 MEQ/100ML IV SOLN
10.0000 meq | Freq: Once | INTRAVENOUS | Status: AC
  Administered 2012-07-25: 10 meq via INTRAVENOUS
  Filled 2012-07-25: qty 100

## 2012-07-25 MED ORDER — POTASSIUM CHLORIDE ER 10 MEQ PO TBCR: 10.0000 meq | EXTENDED_RELEASE_TABLET | Freq: Two times a day (BID) | ORAL | Status: DC

## 2012-07-25 MED ORDER — MORPHINE SULFATE 4 MG/ML IJ SOLN
INTRAMUSCULAR | Status: AC
  Administered 2012-07-25: 4 mg via INTRAVENOUS
  Filled 2012-07-25: qty 1

## 2012-07-25 MED ORDER — POTASSIUM CHLORIDE 10 MEQ/100ML IV SOLN
10.0000 meq | Freq: Once | INTRAVENOUS | Status: AC
  Administered 2012-07-25: 10 meq via INTRAVENOUS

## 2012-07-25 MED ORDER — PROMETHAZINE HCL 25 MG PO TABS: 25.0000 mg | ORAL_TABLET | Freq: Four times a day (QID) | ORAL | Status: DC | PRN

## 2012-07-25 MED ORDER — POTASSIUM CHLORIDE 10 MEQ/100ML IV SOLN
INTRAVENOUS | Status: AC
  Filled 2012-07-25: qty 100

## 2012-07-25 MED ORDER — METOCLOPRAMIDE HCL 5 MG/ML IJ SOLN
10.0000 mg | Freq: Once | INTRAMUSCULAR | Status: AC
  Administered 2012-07-25: 10 mg via INTRAVENOUS
  Filled 2012-07-25: qty 2

## 2012-07-25 MED ORDER — SODIUM CHLORIDE 0.9 % IV SOLN
Freq: Once | INTRAVENOUS | Status: AC
  Administered 2012-07-25: 17:00:00 via INTRAVENOUS

## 2012-07-25 MED ORDER — MORPHINE SULFATE 4 MG/ML IJ SOLN
4.0000 mg | Freq: Once | INTRAMUSCULAR | Status: AC
  Administered 2012-07-25: 4 mg via INTRAVENOUS
  Filled 2012-07-25: qty 1

## 2012-07-25 MED ORDER — PROMETHAZINE HCL 25 MG/ML IJ SOLN
25.0000 mg | Freq: Once | INTRAMUSCULAR | Status: AC
  Administered 2012-07-25: 25 mg via INTRAVENOUS
  Filled 2012-07-25: qty 1

## 2012-07-25 NOTE — ED Notes (Signed)
Patient transported to X-ray 

## 2012-07-25 NOTE — ED Notes (Signed)
Pt brought in by EMS from home c/o flu like symptoms x 3 days n/v with increased BP

## 2012-07-25 NOTE — ED Provider Notes (Signed)
History     CSN: [id number]  Arrival date & time 07/25/12  1341   First MD Initiated Contact with Patient 07/25/12 1345      Chief Complaint  Patient presents with  . Influenza    (Consider location/radiation/quality/duration/timing/severity/associated sxs/prior treatment) HPI Comments: Pt states that she felt like she had fever but has not actually taken it:pt states that she has myalgias and diarrhea  Patient is a 37 y.o. female presenting with flu symptoms. The history is provided by the patient. No language interpreter was used.  Influenza This is a new problem. The current episode started in the past 7 days. The problem occurs constantly. The problem has been unchanged. Associated symptoms include chills, coughing, nausea and vomiting. Nothing aggravates the symptoms. She has tried nothing for the symptoms.    Past Medical History  Diagnosis Date  . GERD (gastroesophageal reflux disease)   . History of migraine     History reviewed. No pertinent past surgical history.  Family History  Problem Relation Age of Onset  . Migraines Mother   . Diabetes Father     History  Substance Use Topics  . Smoking status: Current Every Day Smoker -- 0.5 packs/day    Types: Cigarettes  . Smokeless tobacco: Not on file  . Alcohol Use: No     Comment: occasional    OB History    Grav Para Term Preterm Abortions TAB SAB Ect Mult Living   3    3  1 2         Review of Systems  Constitutional: Positive for chills.  Respiratory: Positive for cough.   Cardiovascular: Negative.   Gastrointestinal: Positive for nausea and vomiting.    Allergies  Ciprofloxacin  Home Medications   Current Outpatient Rx  Name  Route  Sig  Dispense  Refill  . METOCLOPRAMIDE HCL 10 MG PO TABS   Oral   Take 1 tablet (10 mg total) by mouth every 6 (six) hours.   30 tablet   0   . METOCLOPRAMIDE HCL 10 MG PO TABS   Oral   Take 1 tablet (10 mg total) by mouth every 6 (six) hours as needed  (nausea).   30 tablet   0   . OMEPRAZOLE MAGNESIUM 20 MG PO TBEC   Oral   Take 20 mg by mouth daily.           Marland Kitchen PROMETHAZINE HCL 25 MG RE SUPP   Rectal   Place 1 suppository (25 mg total) rectally every 6 (six) hours as needed for nausea.   12 each   0   . PROMETHAZINE HCL 25 MG RE SUPP   Rectal   Place 1 suppository (25 mg total) rectally every 6 (six) hours as needed for nausea.   12 each   0   . PROMETHAZINE HCL 25 MG PO TABS   Oral   Take 1 tablet (25 mg total) by mouth every 6 (six) hours as needed for nausea.   15 tablet   0   . PROMETHAZINE HCL 25 MG PO TABS   Oral   Take 1 tablet (25 mg total) by mouth every 6 (six) hours as needed for nausea.   30 tablet   0     BP 208/107  Pulse 58  Temp 99.1 F (37.3 C) (Oral)  Resp 22  SpO2 98%  LMP 07/24/2012  Physical Exam  Nursing note and vitals reviewed. Constitutional: She is oriented to person, place, and time.  She appears well-developed and well-nourished.  HENT:  Head: Normocephalic and atraumatic.  Right Ear: External ear normal.  Left Ear: External ear normal.  Eyes: Conjunctivae normal and EOM are normal. Pupils are equal, round, and reactive to light.  Neck: Normal range of motion. Neck supple.  Cardiovascular: Normal rate and regular rhythm.   Pulmonary/Chest: Breath sounds normal.  Abdominal: Soft. Bowel sounds are normal.       Diffusely tender:non rigid  Musculoskeletal: Normal range of motion.  Neurological: She is oriented to person, place, and time.  Skin: Skin is warm and dry.    ED Course  Procedures (including critical care time)  Labs Reviewed  BASIC METABOLIC PANEL - Abnormal; Notable for the following:    Potassium 2.2 (*)     Chloride 115 (*)     CO2 17 (*)     Glucose, Bld 115 (*)     BUN 5 (*)     Calcium 5.8 (*)     All other components within normal limits  PREGNANCY, URINE  URINALYSIS, ROUTINE W REFLEX MICROSCOPIC   Dg Chest 2 View  07/25/2012  *RADIOLOGY REPORT*   Clinical Data: Cough and fever  CHEST - 2 VIEW  Comparison:  Dec 07, 2006  Findings: Lungs clear.  Heart size and pulmonary vascularity are normal.  No adenopathy.  No bone lesions.  IMPRESSION: No abnormality noted.   Original Report Authenticated By: Bretta Bang, M.D.      1. Vomiting and diarrhea   2. Hypokalemia       MDM  Pt is feeling better at this time:pt is tolerating po:pt sent home with script for potassium and pt is to follow up with pcp       Teressa Lower, NP 07/25/12 1914

## 2012-07-26 NOTE — ED Provider Notes (Signed)
Medical screening examination/treatment/procedure(s) were performed by non-physician practitioner and as supervising physician I was immediately available for consultation/collaboration.   Charles B. Sheldon, MD 07/26/12 1425 

## 2012-09-16 ENCOUNTER — Encounter: Payer: Self-pay | Admitting: *Deleted

## 2012-09-16 NOTE — Progress Notes (Signed)
Certified mail returned

## 2012-12-11 ENCOUNTER — Emergency Department (HOSPITAL_BASED_OUTPATIENT_CLINIC_OR_DEPARTMENT_OTHER): Payer: 59

## 2012-12-11 ENCOUNTER — Emergency Department (HOSPITAL_BASED_OUTPATIENT_CLINIC_OR_DEPARTMENT_OTHER)
Admission: EM | Admit: 2012-12-11 | Discharge: 2012-12-11 | Disposition: A | Discharge status: Home / Self Care | Payer: 59 | Attending: Emergency Medicine | Admitting: Emergency Medicine

## 2012-12-11 ENCOUNTER — Encounter (HOSPITAL_BASED_OUTPATIENT_CLINIC_OR_DEPARTMENT_OTHER): Payer: Self-pay | Admitting: Family Medicine

## 2012-12-11 DIAGNOSIS — R630 Anorexia: Secondary | ICD-10-CM | POA: Insufficient documentation

## 2012-12-11 DIAGNOSIS — R5381 Other malaise: Secondary | ICD-10-CM | POA: Insufficient documentation

## 2012-12-11 DIAGNOSIS — Z3202 Encounter for pregnancy test, result negative: Secondary | ICD-10-CM | POA: Insufficient documentation

## 2012-12-11 DIAGNOSIS — R112 Nausea with vomiting, unspecified: Secondary | ICD-10-CM | POA: Insufficient documentation

## 2012-12-11 DIAGNOSIS — Z8669 Personal history of other diseases of the nervous system and sense organs: Secondary | ICD-10-CM | POA: Insufficient documentation

## 2012-12-11 DIAGNOSIS — R109 Unspecified abdominal pain: Secondary | ICD-10-CM | POA: Insufficient documentation

## 2012-12-11 DIAGNOSIS — R5383 Other fatigue: Secondary | ICD-10-CM | POA: Insufficient documentation

## 2012-12-11 DIAGNOSIS — I10 Essential (primary) hypertension: Secondary | ICD-10-CM | POA: Insufficient documentation

## 2012-12-11 DIAGNOSIS — F17200 Nicotine dependence, unspecified, uncomplicated: Secondary | ICD-10-CM | POA: Insufficient documentation

## 2012-12-11 DIAGNOSIS — K219 Gastro-esophageal reflux disease without esophagitis: Secondary | ICD-10-CM | POA: Insufficient documentation

## 2012-12-11 DIAGNOSIS — Z79899 Other long term (current) drug therapy: Secondary | ICD-10-CM | POA: Insufficient documentation

## 2012-12-11 HISTORY — DX: Essential (primary) hypertension: I10

## 2012-12-11 LAB — COMPREHENSIVE METABOLIC PANEL
ALT: 12 U/L (ref 0–35)
Albumin: 4.2 g/dL (ref 3.5–5.2)
Calcium: 9.7 mg/dL (ref 8.4–10.5)
GFR calc Af Amer: >90 mL/min (ref >90)
Glucose, Bld: 178 mg/dL — ABNORMAL HIGH (ref 70–99)
Potassium: 3.6 mEq/L (ref 3.5–5.1)
Sodium: 136 mEq/L (ref 135–145)
Total Protein: 8 g/dL (ref 6.0–8.3)

## 2012-12-11 LAB — CBC WITH DIFFERENTIAL/PLATELET
Basophils Absolute: 0 10*3/uL (ref 0.0–0.1)
Basophils Relative: 0 % (ref 0–1)
Eosinophils Absolute: 0 10*3/uL (ref 0.0–0.7)
Eosinophils Relative: 0 % (ref 0–5)
Lymphs Abs: 0.8 10*3/uL (ref 0.7–4.0)
MCH: 23.5 pg — ABNORMAL LOW (ref 26.0–34.0)
MCHC: 31.5 g/dL (ref 30.0–36.0)
MCV: 74.5 fL — ABNORMAL LOW (ref 78.0–100.0)
Neutrophils Relative %: 91 % — ABNORMAL HIGH (ref 43–77)
Platelets: 444 10*3/uL — ABNORMAL HIGH (ref 150–400)
RDW: 16.2 % — ABNORMAL HIGH (ref 11.5–15.5)

## 2012-12-11 LAB — URINALYSIS, ROUTINE W REFLEX MICROSCOPIC
Bilirubin Urine: NEGATIVE
Ketones, ur: 15 mg/dL — AB
Leukocytes, UA: NEGATIVE
Nitrite: NEGATIVE
Protein, ur: 30 mg/dL — AB
Urobilinogen, UA: 0.2 mg/dL (ref 0.0–1.0)
pH: 8 (ref 5.0–8.0)

## 2012-12-11 LAB — URINE MICROSCOPIC-ADD ON

## 2012-12-11 LAB — LIPASE, BLOOD: Lipase: 17 U/L (ref 11–59)

## 2012-12-11 MED ORDER — PROMETHAZINE HCL 25 MG/ML IJ SOLN
25.0000 mg | Freq: Once | INTRAMUSCULAR | Status: AC
  Administered 2012-12-11: 25 mg via INTRAVENOUS
  Filled 2012-12-11: qty 1

## 2012-12-11 MED ORDER — METOCLOPRAMIDE HCL 5 MG/ML IJ SOLN
10.0000 mg | Freq: Once | INTRAMUSCULAR | Status: AC
  Administered 2012-12-11: 10 mg via INTRAVENOUS
  Filled 2012-12-11: qty 2

## 2012-12-11 MED ORDER — SODIUM CHLORIDE 0.9 % IV BOLUS (SEPSIS): 1000.0000 mL | Freq: Once | INTRAVENOUS | Status: DC

## 2012-12-11 MED ORDER — LORAZEPAM 2 MG/ML IJ SOLN
1.0000 mg | Freq: Once | INTRAMUSCULAR | Status: AC
  Administered 2012-12-11: 1 mg via INTRAVENOUS
  Filled 2012-12-11: qty 1

## 2012-12-11 MED ORDER — PROMETHAZINE HCL 25 MG RE SUPP: 25.0000 mg | Freq: Four times a day (QID) | RECTAL | Status: DC | PRN

## 2012-12-11 MED ORDER — HYDROMORPHONE HCL PF 2 MG/ML IJ SOLN
2.0000 mg | Freq: Once | INTRAMUSCULAR | Status: AC
  Administered 2012-12-11: 2 mg via INTRAMUSCULAR
  Filled 2012-12-11: qty 1

## 2012-12-11 MED ORDER — PROMETHAZINE HCL 25 MG PO TABS: 25.0000 mg | ORAL_TABLET | Freq: Four times a day (QID) | ORAL | Status: DC | PRN

## 2012-12-11 MED ORDER — ONDANSETRON HCL 4 MG/2ML IJ SOLN
4.0000 mg | Freq: Once | INTRAMUSCULAR | Status: AC
  Administered 2012-12-11: 4 mg via INTRAVENOUS
  Filled 2012-12-11: qty 2

## 2012-12-11 MED ORDER — PANTOPRAZOLE SODIUM 40 MG IV SOLR
40.0000 mg | Freq: Once | INTRAVENOUS | Status: DC
  Filled 2012-12-11: qty 40

## 2012-12-11 NOTE — ED Notes (Signed)
Pt reports vomiting blood since this morning.

## 2012-12-11 NOTE — ED Notes (Signed)
Pt c/o vomiting blood since this morning. Pt c/o abdomen and chest pain also.

## 2012-12-11 NOTE — ED Notes (Signed)
Patient transported to X-ray 

## 2012-12-11 NOTE — ED Notes (Signed)
Right brachial artery punctured for labs.  Patient tolerated well, direct pressure held for 5 minutes with no sign of hematoma or bleeding.

## 2012-12-11 NOTE — ED Notes (Signed)
Attempted numerous IV access by RN, EDP and FNP without success.  Pt medicated with IM injection.  Unable to obtain labs at present time.

## 2012-12-11 NOTE — ED Provider Notes (Signed)
History     CSN: [id number]  Arrival date & time 12/11/12  1554   First MD Initiated Contact with Patient 12/11/12 1601      Chief Complaint  Patient presents with  . Emesis    (Consider location/radiation/quality/duration/timing/severity/associated sxs/prior treatment) HPI Comments: Patient reports nausea and vomiting with abdominal pain since 8 am.  Some of her emesis has been blood streaked.  Denies frank bleeding. No diarrhea or fever. Patient has a history of similar episodes in the past without official diagnosis.  Has been worked up at [facility] and admitted there with similar symptoms on May 2. Her episodes today are similar to her previous. She has had blood-streaked emesis with similar episodes before. She's been taking Reglan and Phenergan at home without relief.  The history is provided by the patient.    Past Medical History  Diagnosis Date  . GERD (gastroesophageal reflux disease)   . History of migraine   . Hypertension     History reviewed. No pertinent past surgical history.  Family History  Problem Relation Age of Onset  . Migraines Mother   . Diabetes Father     History  Substance Use Topics  . Smoking status: Current Every Day Smoker -- 0.50 packs/day    Types: Cigarettes  . Smokeless tobacco: Not on file  . Alcohol Use: No     Comment: occasional    OB History   Grav Para Term Preterm Abortions TAB SAB Ect Mult Living   3    3  1 2         Review of Systems  Constitutional: Positive for activity change and appetite change. Negative for fever.  Respiratory: Negative for cough, chest tightness and shortness of breath.   Cardiovascular: Negative for chest pain.  Gastrointestinal: Positive for nausea, vomiting and abdominal pain.  Genitourinary: Negative for dysuria, vaginal bleeding and vaginal discharge.  Musculoskeletal: Negative for back pain.  Skin: Negative for rash.  Neurological: Positive for weakness.  A complete 10 system review of  systems was obtained and all systems are negative except as noted in the HPI and PMH.    Allergies  Ciprofloxacin  Home Medications   Current Outpatient Rx  Name  Route  Sig  Dispense  Refill  . metoprolol succinate (TOPROL-XL) 50 MG 24 hr tablet   Oral   Take 50 mg by mouth daily. Take with or immediately following a meal.         . EXPIRED: metoCLOPramide (REGLAN) 10 MG tablet   Oral   Take 1 tablet (10 mg total) by mouth every 6 (six) hours.   30 tablet   0   . EXPIRED: metoCLOPramide (REGLAN) 10 MG tablet   Oral   Take 1 tablet (10 mg total) by mouth every 6 (six) hours as needed (nausea).   30 tablet   0   . omeprazole (PRILOSEC OTC) 20 MG tablet   Oral   Take 20 mg by mouth daily.           . potassium chloride (K-DUR) 10 MEQ tablet   Oral   Take 1 tablet (10 mEq total) by mouth 2 (two) times daily.   14 tablet   0   . EXPIRED: promethazine (PHENERGAN) 25 MG suppository   Rectal   Place 1 suppository (25 mg total) rectally every 6 (six) hours as needed for nausea.   12 each   0   . EXPIRED: promethazine (PHENERGAN) 25 MG suppository   Rectal  Place 1 suppository (25 mg total) rectally every 6 (six) hours as needed for nausea.   12 each   0   . EXPIRED: promethazine (PHENERGAN) 25 MG tablet   Oral   Take 1 tablet (25 mg total) by mouth every 6 (six) hours as needed for nausea.   15 tablet   0   . EXPIRED: promethazine (PHENERGAN) 25 MG tablet   Oral   Take 1 tablet (25 mg total) by mouth every 6 (six) hours as needed for nausea.   30 tablet   0   . promethazine (PHENERGAN) 25 MG tablet   Oral   Take 1 tablet (25 mg total) by mouth every 6 (six) hours as needed for nausea.   20 tablet   0     BP 129/87  Pulse 86  Temp(Src) 98.5 F (36.9 C) (Oral)  Resp 16  SpO2 100%  LMP 11/27/2012  Physical Exam  Constitutional: She is oriented to person, place, and time. She appears well-developed and well-nourished. She appears distressed.   Vomiting.. Clear emesis with blood streak  HENT:  Head: Normocephalic and atraumatic.  Mouth/Throat: Oropharynx is clear and moist. No oropharyngeal exudate.  Eyes: Conjunctivae and EOM are normal. Pupils are equal, round, and reactive to light.  Neck: Normal range of motion. Neck supple.  Cardiovascular: Normal rate, regular rhythm and normal heart sounds.   No murmur heard. Pulmonary/Chest: Effort normal and breath sounds normal. No respiratory distress.  Abdominal: Soft. There is tenderness. There is no rebound and no guarding.  Diffuse tenderness without peritoneal signs  Musculoskeletal: Normal range of motion. She exhibits no edema and no tenderness.  Neurological: She is alert and oriented to person, place, and time. No cranial nerve deficit. She exhibits normal muscle tone. Coordination normal.  Skin: Skin is warm.    ED Course  Procedures (including critical care time)  Labs Reviewed  URINALYSIS, ROUTINE W REFLEX MICROSCOPIC - Abnormal; Notable for the following:    Ketones, ur 15 (*)    Protein, ur 30 (*)    All other components within normal limits  CBC WITH DIFFERENTIAL - Abnormal; Notable for the following:    WBC 13.1 (*)    Hemoglobin 10.3 (*)    HCT 32.7 (*)    MCV 74.5 (*)    MCH 23.5 (*)    RDW 16.2 (*)    Platelets 444 (*)    Neutrophils Relative % 91 (*)    Neutro Abs 11.9 (*)    Lymphocytes Relative 6 (*)    All other components within normal limits  COMPREHENSIVE METABOLIC PANEL - Abnormal; Notable for the following:    Glucose, Bld 178 (*)    All other components within normal limits  URINE MICROSCOPIC-ADD ON - Abnormal; Notable for the following:    Squamous Epithelial / LPF FEW (*)    Bacteria, UA FEW (*)    Casts HYALINE CASTS (*)    All other components within normal limits  PREGNANCY, URINE  LIPASE, BLOOD   Dg Abd Acute W/chest  12/11/2012   *RADIOLOGY REPORT*  Clinical Data: Chest and abdomen pain, nausea and vomiting, smoking history   ACUTE ABDOMEN SERIES (ABDOMEN 2 VIEW & CHEST 1 VIEW)  Comparison: The chest x-ray of 07/25/2012 and chest with acute abdomen of 03/25/2011  Findings: No active infiltrate or effusion is seen.  Mediastinal contours appear stable.  The heart is within normal limits in size. No acute bony abnormality is seen.  Erect and supine  views the abdomen show no bowel obstruction.  No free air is seen.  There is a rounded opacity just to the left of the L-2 vertebral body most likely an ingested pill. No opaque calculi are noted.  IMPRESSION:  1.  No active lung disease. 2.  No bowel obstruction.  No free air.   Original Report Authenticated By: Dwyane Dee, M.D.     No diagnosis found.    MDM  Recurrent nausea, vomiting and abdominal pain. Previous record review she's had workup at [facility] without a diagnosis. Cyclic vomiting syndrome has been considered.  Abdomen soft without peritoneal signs.  Difficult IV access.  Given IM meds.  Patient did have an EGD at [facility] in August 2013 IMPRESSIONS: 1. Medium hiatus hernia at the gastroesophageal junction 2. Normal stomach 3. Normal duodenum 4. Retroflexion views revealed a hiatal hernia  Records obtained from [facility] from April 30. Patient had a CT scan which showed a right ovarian cyst. And uterine fibroids. She denies any NSAID use patient was treated with Phenergan, Zofran Protonix. She was seen by gastroenterology. 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  Patient's hemoglobin is stable. Her symptoms are controlled in the ED with IV pain medication and antiemetics. She is tolerating by mouth liquids and requesting discharge. Blood pressure is much improved on recheck. She'll followup with her PCP and GI Dr. in [facility].  Glynn Octave, MD 12/12/12 [phone]

## 2013-03-07 ENCOUNTER — Emergency Department (HOSPITAL_BASED_OUTPATIENT_CLINIC_OR_DEPARTMENT_OTHER)
Admission: EM | Admit: 2013-03-07 | Discharge: 2013-03-07 | Disposition: A | Discharge status: Home / Self Care | Payer: 59 | Attending: Emergency Medicine | Admitting: Emergency Medicine

## 2013-03-07 ENCOUNTER — Encounter (HOSPITAL_BASED_OUTPATIENT_CLINIC_OR_DEPARTMENT_OTHER): Payer: Self-pay | Admitting: Emergency Medicine

## 2013-03-07 DIAGNOSIS — Z79899 Other long term (current) drug therapy: Secondary | ICD-10-CM | POA: Insufficient documentation

## 2013-03-07 DIAGNOSIS — I10 Essential (primary) hypertension: Secondary | ICD-10-CM | POA: Insufficient documentation

## 2013-03-07 DIAGNOSIS — Z3202 Encounter for pregnancy test, result negative: Secondary | ICD-10-CM | POA: Insufficient documentation

## 2013-03-07 DIAGNOSIS — R109 Unspecified abdominal pain: Secondary | ICD-10-CM | POA: Insufficient documentation

## 2013-03-07 DIAGNOSIS — R1110 Vomiting, unspecified: Secondary | ICD-10-CM

## 2013-03-07 DIAGNOSIS — K219 Gastro-esophageal reflux disease without esophagitis: Secondary | ICD-10-CM | POA: Insufficient documentation

## 2013-03-07 DIAGNOSIS — Z8679 Personal history of other diseases of the circulatory system: Secondary | ICD-10-CM | POA: Insufficient documentation

## 2013-03-07 DIAGNOSIS — R112 Nausea with vomiting, unspecified: Secondary | ICD-10-CM | POA: Insufficient documentation

## 2013-03-07 DIAGNOSIS — F17200 Nicotine dependence, unspecified, uncomplicated: Secondary | ICD-10-CM | POA: Insufficient documentation

## 2013-03-07 DIAGNOSIS — Z8719 Personal history of other diseases of the digestive system: Secondary | ICD-10-CM | POA: Insufficient documentation

## 2013-03-07 HISTORY — DX: Cyclical vomiting syndrome unrelated to migraine: R11.15

## 2013-03-07 HISTORY — DX: Diverticulitis of intestine, part unspecified, without perforation or abscess without bleeding: K57.92

## 2013-03-07 LAB — URINALYSIS, ROUTINE W REFLEX MICROSCOPIC
Glucose, UA: NEGATIVE mg/dL
Ketones, ur: 15 mg/dL — AB
Leukocytes, UA: NEGATIVE
Nitrite: NEGATIVE
Protein, ur: NEGATIVE mg/dL
Urobilinogen, UA: 0.2 mg/dL (ref 0.0–1.0)

## 2013-03-07 LAB — COMPREHENSIVE METABOLIC PANEL
AST: 18 U/L (ref 0–37)
Albumin: 4.2 g/dL (ref 3.5–5.2)
BUN: 9 mg/dL (ref 6–23)
Creatinine, Ser: 0.7 mg/dL (ref 0.50–1.10)
Potassium: 4 mEq/L (ref 3.5–5.1)
Total Protein: 7.8 g/dL (ref 6.0–8.3)

## 2013-03-07 LAB — CBC WITH DIFFERENTIAL/PLATELET
Basophils Absolute: 0 10*3/uL (ref 0.0–0.1)
Basophils Relative: 0 % (ref 0–1)
Eosinophils Absolute: 0 10*3/uL (ref 0.0–0.7)
Hemoglobin: 11 g/dL — ABNORMAL LOW (ref 12.0–15.0)
MCH: 24.3 pg — ABNORMAL LOW (ref 26.0–34.0)
MCHC: 31.3 g/dL (ref 30.0–36.0)
Monocytes Relative: 4 % (ref 3–12)
Neutrophils Relative %: 81 % — ABNORMAL HIGH (ref 43–77)
RDW: 19.1 % — ABNORMAL HIGH (ref 11.5–15.5)

## 2013-03-07 MED ORDER — METOCLOPRAMIDE HCL 10 MG PO TABS: 10.0000 mg | ORAL_TABLET | Freq: Four times a day (QID) | ORAL | Status: DC

## 2013-03-07 MED ORDER — ONDANSETRON 8 MG PO TBDP
ORAL_TABLET | ORAL | Status: AC
  Administered 2013-03-07: 8 mg
  Filled 2013-03-07: qty 1

## 2013-03-07 MED ORDER — HYDROCODONE-ACETAMINOPHEN 5-325 MG PO TABS: 1.0000 | ORAL_TABLET | Freq: Three times a day (TID) | ORAL | Status: DC | PRN

## 2013-03-07 MED ORDER — PROMETHAZINE HCL 25 MG RE SUPP: 25.0000 mg | Freq: Four times a day (QID) | RECTAL | Status: DC | PRN

## 2013-03-07 MED ORDER — SODIUM CHLORIDE 0.9 % IV BOLUS (SEPSIS): 1000.0000 mL | Freq: Once | INTRAVENOUS | Status: DC

## 2013-03-07 MED ORDER — SODIUM CHLORIDE 0.9 % IV SOLN
80.0000 mg | Freq: Once | INTRAVENOUS | Status: DC
  Filled 2013-03-07: qty 80

## 2013-03-07 NOTE — ED Notes (Signed)
Pt vomiting today "pretty consistently".  Sts she has been vomiting blood for past hour.  Sts she has been diagnosed with Cyclic Vomiting syndrome and Diverticulitis but can't get in to see her GI doc until October. Denies diarrhea.  Abd pain in epigastric and LUQ area.

## 2013-03-07 NOTE — ED Provider Notes (Signed)
CSN: [id number]     Arrival date & time 03/07/13  1957 History     First MD Initiated Contact with Patient 03/07/13 2020     Chief Complaint  Patient presents with  . Hematemesis   (Consider location/radiation/quality/duration/timing/severity/associated sxs/prior Treatment) HPI Patient presents with one day of hematemesis, abdominal pain. She was in her usual state of total today, when she woke up with nausea, abdominal pain.  Since onset symptoms have been persistent, and the patient has been intolerant of oral medication, including PPI, Phenergan.  No concurrent chest pain, dyspnea, lightheadedness, syncope, diarrhea. Patient notes that she has had multiple similar episodes, including one requiring hospitalization earlier this year. Last EGD was earlier this year. No prior abdominal surgery.  Past Medical History  Diagnosis Date  . GERD (gastroesophageal reflux disease)   . History of migraine   . Hypertension   . Cyclic vomiting syndrome   . Diverticulitis    Past Surgical History  Procedure Laterality Date  . Ectopic pregnancy surgery     Family History  Problem Relation Age of Onset  . Migraines Mother   . Diabetes Father    History  Substance Use Topics  . Smoking status: Current Every Day Smoker -- 0.50 packs/day    Types: Cigarettes  . Smokeless tobacco: Not on file  . Alcohol Use: No     Comment: occasional   OB History   Grav Para Term Preterm Abortions TAB SAB Ect Mult Living   3    3  1 2        Review of Systems  All other systems reviewed and are negative.    Allergies  Ciprofloxacin  Home Medications   Current Outpatient Rx  Name  Route  Sig  Dispense  Refill  . metoprolol succinate (TOPROL-XL) 50 MG 24 hr tablet   Oral   Take 50 mg by mouth daily. Take with or immediately following a meal.         . pantoprazole (PROTONIX) 40 MG tablet   Oral   Take 40 mg by mouth 2 (two) times daily.         Marland Kitchen EXPIRED: metoCLOPramide (REGLAN) 10  MG tablet   Oral   Take 1 tablet (10 mg total) by mouth every 6 (six) hours.   30 tablet   0   . EXPIRED: metoCLOPramide (REGLAN) 10 MG tablet   Oral   Take 1 tablet (10 mg total) by mouth every 6 (six) hours as needed (nausea).   30 tablet   0   . omeprazole (PRILOSEC OTC) 20 MG tablet   Oral   Take 20 mg by mouth daily.           . potassium chloride (K-DUR) 10 MEQ tablet   Oral   Take 1 tablet (10 mEq total) by mouth 2 (two) times daily.   14 tablet   0   . EXPIRED: promethazine (PHENERGAN) 25 MG suppository   Rectal   Place 1 suppository (25 mg total) rectally every 6 (six) hours as needed for nausea.   12 each   0   . EXPIRED: promethazine (PHENERGAN) 25 MG suppository   Rectal   Place 1 suppository (25 mg total) rectally every 6 (six) hours as needed for nausea.   12 each   0   . EXPIRED: promethazine (PHENERGAN) 25 MG tablet   Oral   Take 1 tablet (25 mg total) by mouth every 6 (six) hours as needed for nausea.  30 tablet   0   . promethazine (PHENERGAN) 25 MG tablet   Oral   Take 1 tablet (25 mg total) by mouth every 6 (six) hours as needed for nausea.   20 tablet   0   . EXPIRED: promethazine (PHENERGAN) 25 MG tablet   Oral   Take 1 tablet (25 mg total) by mouth every 6 (six) hours as needed for nausea.   15 tablet   0    Ht 5\' 11"  (1.803 m)  Wt 190 lb (86.183 kg)  BMI 26.51 kg/m2  LMP 02/14/2013 Physical Exam  Nursing note and vitals reviewed. Constitutional: She is oriented to person, place, and time. She appears well-developed and well-nourished. No distress.  HENT:  Head: Normocephalic and atraumatic.  Eyes: Conjunctivae and EOM are normal.  Cardiovascular: Normal rate and regular rhythm.   Pulmonary/Chest: Effort normal and breath sounds normal. No stridor. No respiratory distress.  Abdominal: She exhibits no distension.  Musculoskeletal: She exhibits no edema.  Neurological: She is alert and oriented to person, place, and time. No  cranial nerve deficit.  Skin: Skin is warm and dry.  Psychiatric: She has a normal mood and affect.    ED Course   Procedures (including critical care time)  Labs Reviewed  URINALYSIS, ROUTINE W REFLEX MICROSCOPIC - Abnormal; Notable for the following:    Ketones, ur 15 (*)    All other components within normal limits  PREGNANCY, URINE  CBC WITH DIFFERENTIAL  COMPREHENSIVE METABOLIC PANEL   No results found. No diagnosis found.   11:08 PM Patient and family member advise all results, including normal hemoglobin.  Patient remains in no distress, with no further vomiting.  We discussed, at length, interventions to abort further episodes of vomiting, and the need for followup. MDM  Patient with a history of cyclic vomiting, now presents with one day of vomiting, including some production of blood.  Patient has no vomiting here, his hemodynamic stable, and hemoglobin is at baseline.  Absent distress, with the aforementioned stable vital signs, she was discharged in stable condition to follow up with her GI team  Gerhard Munch, MD 03/07/13 2309

## 2013-03-07 NOTE — ED Notes (Signed)
IV access attempted multiple times by multiple RN's and by RT without success. Small specimen obtained and sent to lab but lab advises that sample was clotted.

## 2013-03-07 NOTE — ED Notes (Signed)
Have attempted several blood draws and several IV attempts being unsuccessful by multiple RNs.  Pt. Tolerated well and very cooperative with the process.  No distress noted in pt. And no vomiting

## 2013-10-31 ENCOUNTER — Emergency Department: Payer: Self-pay | Admitting: Emergency Medicine

## 2013-11-01 LAB — CBC WITH DIFFERENTIAL/PLATELET
Basophil #: 0.1 10*3/uL (ref 0.0–0.1)
Basophil %: 0.8 %
Eosinophil #: 0 10*3/uL (ref 0.0–0.7)
Eosinophil %: 0.1 %
HCT: 35.1 % (ref 35.0–47.0)
HGB: 10.5 g/dL — AB (ref 12.0–16.0)
Lymphocyte #: 2.3 10*3/uL (ref 1.0–3.6)
Lymphocyte %: 13.3 %
MCH: 23.6 pg — ABNORMAL LOW (ref 26.0–34.0)
MCHC: 29.8 g/dL — ABNORMAL LOW (ref 32.0–36.0)
MCV: 79 fL — AB (ref 80–100)
MONO ABS: 0.5 x10 3/mm (ref 0.2–0.9)
Monocyte %: 2.8 %
NEUTROS PCT: 83 %
Neutrophil #: 14.1 10*3/uL — ABNORMAL HIGH (ref 1.4–6.5)
Platelet: 397 10*3/uL (ref 150–440)
RBC: 4.44 10*6/uL (ref 3.80–5.20)
RDW: 17.8 % — AB (ref 11.5–14.5)
WBC: 17 10*3/uL — ABNORMAL HIGH (ref 3.6–11.0)

## 2013-11-01 LAB — URINALYSIS, COMPLETE
Bilirubin,UR: NEGATIVE
Glucose,UR: 50 mg/dL (ref 0–75)
LEUKOCYTE ESTERASE: NEGATIVE
NITRITE: NEGATIVE
PH: 6 (ref 4.5–8.0)
PROTEIN: NEGATIVE
Specific Gravity: 1.02 (ref 1.003–1.030)
Squamous Epithelial: 1

## 2013-11-01 LAB — COMPREHENSIVE METABOLIC PANEL
ALBUMIN: 4.1 g/dL (ref 3.4–5.0)
Alkaline Phosphatase: 73 U/L
Anion Gap: 13 (ref 7–16)
BILIRUBIN TOTAL: 0.6 mg/dL (ref 0.2–1.0)
BUN: 9 mg/dL (ref 7–18)
CHLORIDE: 104 mmol/L (ref 98–107)
CREATININE: 0.9 mg/dL (ref 0.60–1.30)
Calcium, Total: 9.5 mg/dL (ref 8.5–10.1)
Co2: 22 mmol/L (ref 21–32)
EGFR (African American): >60
EGFR (Non-African Amer.): >60
GLUCOSE: 192 mg/dL — AB (ref 65–99)
Osmolality: 281 (ref 275–301)
Potassium: 3.8 mmol/L (ref 3.5–5.1)
SGOT(AST): 15 U/L (ref 15–37)
SGPT (ALT): 19 U/L (ref 12–78)
Sodium: 139 mmol/L (ref 136–145)
TOTAL PROTEIN: 7.9 g/dL (ref 6.4–8.2)

## 2013-11-01 LAB — PREGNANCY, URINE: Pregnancy Test, Urine: NEGATIVE m[IU]/mL

## 2013-11-01 LAB — LIPASE, BLOOD: Lipase: 76 U/L (ref 73–393)

## 2014-04-12 ENCOUNTER — Emergency Department (HOSPITAL_COMMUNITY)
Admission: EM | Admit: 2014-04-12 | Discharge: 2014-04-12 | Disposition: A | Discharge status: Home / Self Care | Payer: 59 | Attending: Emergency Medicine | Admitting: Emergency Medicine

## 2014-04-12 ENCOUNTER — Encounter (HOSPITAL_COMMUNITY): Payer: Self-pay | Admitting: Emergency Medicine

## 2014-04-12 DIAGNOSIS — R112 Nausea with vomiting, unspecified: Secondary | ICD-10-CM | POA: Insufficient documentation

## 2014-04-12 DIAGNOSIS — R1115 Cyclical vomiting syndrome unrelated to migraine: Secondary | ICD-10-CM | POA: Insufficient documentation

## 2014-04-12 DIAGNOSIS — K219 Gastro-esophageal reflux disease without esophagitis: Secondary | ICD-10-CM | POA: Insufficient documentation

## 2014-04-12 DIAGNOSIS — F17200 Nicotine dependence, unspecified, uncomplicated: Secondary | ICD-10-CM | POA: Insufficient documentation

## 2014-04-12 DIAGNOSIS — Z79899 Other long term (current) drug therapy: Secondary | ICD-10-CM | POA: Insufficient documentation

## 2014-04-12 DIAGNOSIS — I10 Essential (primary) hypertension: Secondary | ICD-10-CM | POA: Insufficient documentation

## 2014-04-12 LAB — I-STAT CHEM 8, ED
BUN: 6 mg/dL (ref 6–23)
CHLORIDE: 105 meq/L (ref 96–112)
Calcium, Ion: 1.19 mmol/L (ref 1.12–1.23)
Creatinine, Ser: 0.8 mg/dL (ref 0.50–1.10)
Glucose, Bld: 124 mg/dL — ABNORMAL HIGH (ref 70–99)
HEMATOCRIT: 35 % — AB (ref 36.0–46.0)
HEMOGLOBIN: 11.9 g/dL — AB (ref 12.0–15.0)
POTASSIUM: 3.8 meq/L (ref 3.7–5.3)
SODIUM: 140 meq/L (ref 137–147)
TCO2: 24 mmol/L (ref 0–100)

## 2014-04-12 LAB — CBC WITH DIFFERENTIAL/PLATELET
BASOS ABS: 0 10*3/uL (ref 0.0–0.1)
BASOS PCT: 0 % (ref 0–1)
EOS ABS: 0 10*3/uL (ref 0.0–0.7)
Eosinophils Relative: 0 % (ref 0–5)
HCT: 32.5 % — ABNORMAL LOW (ref 36.0–46.0)
HEMOGLOBIN: 9.9 g/dL — AB (ref 12.0–15.0)
Lymphocytes Relative: 14 % (ref 12–46)
Lymphs Abs: 1.6 10*3/uL (ref 0.7–4.0)
MCH: 23.3 pg — AB (ref 26.0–34.0)
MCHC: 30.5 g/dL (ref 30.0–36.0)
MCV: 76.5 fL — ABNORMAL LOW (ref 78.0–100.0)
Monocytes Absolute: 0.4 10*3/uL (ref 0.1–1.0)
Monocytes Relative: 4 % (ref 3–12)
NEUTROS ABS: 9.6 10*3/uL — AB (ref 1.7–7.7)
NEUTROS PCT: 82 % — AB (ref 43–77)
PLATELETS: 407 10*3/uL — AB (ref 150–400)
RBC: 4.25 MIL/uL (ref 3.87–5.11)
RDW: 16.6 % — AB (ref 11.5–15.5)
WBC: 11.7 10*3/uL — ABNORMAL HIGH (ref 4.0–10.5)

## 2014-04-12 MED ORDER — DIPHENHYDRAMINE HCL 50 MG/ML IJ SOLN
12.5000 mg | Freq: Once | INTRAMUSCULAR | Status: AC
  Administered 2014-04-12: 12.5 mg via INTRAVENOUS
  Filled 2014-04-12: qty 1

## 2014-04-12 MED ORDER — PANTOPRAZOLE SODIUM 40 MG IV SOLR
40.0000 mg | Freq: Once | INTRAVENOUS | Status: AC
  Administered 2014-04-12: 40 mg via INTRAVENOUS
  Filled 2014-04-12: qty 40

## 2014-04-12 MED ORDER — SODIUM CHLORIDE 0.9 % IV SOLN
Freq: Once | INTRAVENOUS | Status: AC
  Administered 2014-04-12: 04:00:00 via INTRAVENOUS

## 2014-04-12 MED ORDER — LORAZEPAM 2 MG/ML IJ SOLN
1.0000 mg | Freq: Once | INTRAMUSCULAR | Status: AC
  Administered 2014-04-12: 1 mg via INTRAVENOUS
  Filled 2014-04-12: qty 1

## 2014-04-12 MED ORDER — HYDROMORPHONE HCL 1 MG/ML IJ SOLN
0.5000 mg | Freq: Once | INTRAMUSCULAR | Status: AC
  Administered 2014-04-12: 0.5 mg via INTRAVENOUS
  Filled 2014-04-12: qty 1

## 2014-04-12 MED ORDER — LORAZEPAM 1 MG PO TABS: 1.0000 mg | ORAL_TABLET | Freq: Four times a day (QID) | ORAL | Status: DC | PRN

## 2014-04-12 MED ORDER — PROMETHAZINE HCL 25 MG RE SUPP: 25.0000 mg | Freq: Four times a day (QID) | RECTAL | Status: DC | PRN

## 2014-04-12 NOTE — ED Notes (Signed)
Pt given ginger ale and cup of ice. 

## 2014-04-12 NOTE — Discharge Instructions (Signed)
Cyclic Vomiting Syndrome °Cyclic vomiting syndrome is a benign condition in which patients experience bouts or cycles of severe nausea and vomiting that last for hours or even days. The bouts of nausea and vomiting alternate with longer periods of no symptoms and generally good health. Cyclic vomiting syndrome occurs mostly in children, but can affect adults. °CAUSES  °CVS has no known cause. Each episode is typically similar to the previous ones. The episodes tend to:  °· Start at about the same time of day. °· Last the same length of time. °· Present the same symptoms at the same level of intensity. °Cyclic vomiting syndrome can begin at any age in children and adults. Cyclic vomiting syndrome usually starts between the ages of 3 and 7 years. In adults, episodes tend to occur less often than they do in children, but they last longer. Furthermore, the events or situations that trigger episodes in adults cannot always be pinpointed as easily as they can in children. °There are 4 phases of cyclic vomiting syndrome: °1. Prodrome. The prodrome phase signals that an episode of nausea and vomiting is about to begin. This phase can last from just a few minutes to several hours. This phase is often marked by belly (abdominal) pain. Sometimes taking medicine early in the prodrome phase can stop an episode in progress. However, sometimes there is no warning. A person may simply wake up in the middle of the night or early morning and begin vomiting. °2. Episode. The episode phase consists of: °· Severe vomiting. °· Nausea. °· Gagging (retching). °3. Recovery. The recovery phase begins when the nausea and vomiting stop. Healthy color, appetite, and energy return. °4. Symptom-free interval. The symptom-free interval phase is the period between episodes when no symptoms are present. °TRIGGERS °Episodes can be triggered by an infection or event. Examples of triggers include: °· Infections. °· Colds, allergies, sinus problems, and  the flu. °· Eating certain foods such as chocolate or cheese. °· Foods with monosodium glutamate (MSG) or preservatives. °· Fast foods. °· Pre-packaged foods. °· Foods with low nutritional value (junk foods). °· Overeating. °· Eating just before going to bed. °· Hot weather. °· Dehydration. °· Not enough sleep or poor sleep quality. °· Physical exhaustion. °· Menstruation. °· Motion sickness. °· Emotional stress (school or home difficulties). °· Excitement or stress. °SYMPTOMS  °The main symptoms of cyclic vomiting syndrome are: °· Severe vomiting. °· Nausea. °· Gagging (retching). °Episodes usually begin at night or the first thing in the morning. Episodes may include vomiting or retching up to 5 or 6 times an hour during the worst of the episode. Episodes usually last anywhere from 1 to 4 days. Episodes can last for up to 10 days. Other symptoms include: °· Paleness. °· Exhaustion. °· Listlessness. °· Abdominal pain. °· Loose stools or diarrhea. °Sometimes the nausea and vomiting are so severe that a person appears to be almost unconscious. Sensitivity to light, headache, fever, dizziness, may also accompany an episode. In addition, the vomiting may cause drooling and excessive thirst. Drinking water usually leads to more vomiting, though the water can dilute the acid in the vomit, making the episode a little less painful. Continuous vomiting can lead to dehydration, which means that the body has lost excessive water and salts. °DIAGNOSIS  °Cyclic vomiting syndrome is hard to diagnose because there are no clear tests to identify it. A caregiver must diagnose cyclic vomiting syndrome by looking at symptoms and medical history. A caregiver must exclude more common diseases   or disorders that can also cause nausea and vomiting. Also, diagnosis takes time because caregivers need to identify a pattern or cycle to the vomiting. °TREATMENT  °Cyclic vomiting syndrome cannot be cured. Treatment varies, but people with  cyclic vomiting syndrome should get plenty of rest and sleep and take medications that prevent, stop, or lessen the vomiting episodes and other symptoms. °People whose episodes are frequent and long-lasting may be treated during the symptom-free intervals in an effort to prevent or ease future episodes. The symptom-free phase is a good time to eliminate anything known to trigger an episode. For example, if episodes are brought on by stress or excitement, this period is the time to find ways to reduce stress and stay calm. If sinus problems or allergies cause episodes, those conditions should be treated. The triggers listed above should be avoided or prevented. °Because of the similarities between migraine and cyclic vomiting syndrome, caregivers treat some people with severe cyclic vomiting syndrome with drugs that are also used for migraine headaches. The drugs are designed to: °· Prevent episodes. °· Reduce their frequency. °· Lessen their severity. °HOME CARE INSTRUCTIONS °Once a vomiting episode begins, treatment is supportive. It helps to stay in bed and sleep in a dark, quiet room. Severe nausea and vomiting may require hospitalization and intravenous (IV) fluids to prevent dehydration. Relaxing medications (sedatives) may help if the nausea continues. Sometimes, during the prodrome phase, it is possible to stop an episode from happening altogether. Only take over-the-counter or prescription medicines for pain, discomfort or fever as directed by your caregiver. Do not give aspirin to children. °During the recovery phase, drinking water and replacing lost electrolytes (salts in the blood) are very important. Electrolytes are salts that the body needs to function well and stay healthy. Symptoms during the recovery phase can vary. Some people find that their appetites return to normal immediately, while others need to begin by drinking clear liquids and then move slowly to solid food. °RELATED COMPLICATIONS °The  severe vomiting that defines cyclic vomiting syndrome is a risk factor for several complications: °· Dehydration--Vomiting causes the body to lose water quickly. °· Electrolyte imbalance--Vomiting also causes the body to lose the important salts it needs to keep working properly. °· Peptic esophagitis--The tube that connects the mouth to the stomach (esophagus) becomes injured from the stomach acid that comes up with the vomit. °· Hematemesis--The esophagus becomes irritated and bleeds, so blood mixes with the vomit. °· Mallory-Weiss tear--The lower end of the esophagus may tear open or the stomach may bruise from vomiting or retching. °· Tooth decay--The acid in the vomit can hurt the teeth by corroding the tooth enamel. °SEEK MEDICAL CARE IF: °You have questions or problems. °Document Released: 09/11/2001 Document Revised: 09/25/2011 Document Reviewed: 10/10/2010 °[employer]® Patient Information ©2015 [employer]. This information is not intended to replace advice given to you by your health care provider. Make sure you discuss any questions you have with your health care provider. ° °

## 2014-04-12 NOTE — ED Notes (Addendum)
Pt ambulatory upon discharge.  

## 2014-04-12 NOTE — ED Provider Notes (Signed)
Medical screening examination/treatment/procedure(s) were performed by non-physician practitioner and as supervising physician I was immediately available for consultation/collaboration.   EKG Interpretation None        Tomasita Crumble, MD 04/12/14 1222

## 2014-04-12 NOTE — ED Provider Notes (Signed)
Patient signed out to me by Manus Rudd, NP at change of shift. Patient with cyclic vomiting syndrome. Labs pending. If these are ok patient can go home. Will watch and re-assess.   Patient feels significantly improved. Labs unremarkable. Will give phenergan and small amount of ativan. Discussed reasons to return to ED. Vital signs stable for discharge. Patient / Family / Caregiver informed of clinical course, understand medical decision-making process, and agree with plan.   Results for orders placed during the hospital encounter of 04/12/14  CBC WITH DIFFERENTIAL      Result Value Ref Range   WBC 11.7 (*) 4.0 - 10.5 K/uL   RBC 4.25  3.87 - 5.11 MIL/uL   Hemoglobin 9.9 (*) 12.0 - 15.0 g/dL   HCT 16.1 (*) 09.6 - 04.5 %   MCV 76.5 (*) 78.0 - 100.0 fL   MCH 23.3 (*) 26.0 - 34.0 pg   MCHC 30.5  30.0 - 36.0 g/dL   RDW 40.9 (*) 81.1 - 91.4 %   Platelets 407 (*) 150 - 400 K/uL   Neutrophils Relative % 82 (*) 43 - 77 %   Neutro Abs 9.6 (*) 1.7 - 7.7 K/uL   Lymphocytes Relative 14  12 - 46 %   Lymphs Abs 1.6  0.7 - 4.0 K/uL   Monocytes Relative 4  3 - 12 %   Monocytes Absolute 0.4  0.1 - 1.0 K/uL   Eosinophils Relative 0  0 - 5 %   Eosinophils Absolute 0.0  0.0 - 0.7 K/uL   Basophils Relative 0  0 - 1 %   Basophils Absolute 0.0  0.0 - 0.1 K/uL  I-STAT CHEM 8, ED      Result Value Ref Range   Sodium 140  137 - 147 mEq/L   Potassium 3.8  3.7 - 5.3 mEq/L   Chloride 105  96 - 112 mEq/L   BUN 6  6 - 23 mg/dL   Creatinine, Ser 7.82  0.50 - 1.10 mg/dL   Glucose, Bld 956 (*) 70 - 99 mg/dL   Calcium, Ion 2.13  0.86 - 1.23 mmol/L   TCO2 24  0 - 100 mmol/L   Hemoglobin 11.9 (*) 12.0 - 15.0 g/dL   HCT 57.8 (*) 46.9 - 62.9 %     Mora Bellman, PA-C 04/12/14 [phone]

## 2014-04-12 NOTE — ED Notes (Signed)
Bed: ZO10 Expected date:  Expected time:  Means of arrival:  Comments: Bed 23, EMS, 46 F, n/v/d

## 2014-04-12 NOTE — ED Notes (Signed)
Pt came in via EMS  From home states she has history of cyclic vomiting,  Has seen GI doctors for about the past 10 years ,  No diagnosis of any though,  Pt received Zofran 8 mg IV pta via EMS

## 2014-04-12 NOTE — ED Notes (Signed)
Pt states only nausea at this time,   Denies pain, Dondra Spry NP aware

## 2014-04-27 NOTE — ED Provider Notes (Signed)
CSN: [id number]     Arrival date & time 04/12/14  0344 History   First MD Initiated Contact with Patient 04/12/14 0350     Chief Complaint  Patient presents with  . Nausea  . Emesis     (Consider location/radiation/quality/duration/timing/severity/associated sxs/prior Treatment) Patient is a 38 y.o. female presenting with vomiting. The history is provided by the patient.  Emesis Severity:  Moderate Timing:  Intermittent Quality:  Bilious material Chronicity:  Recurrent Associated symptoms: abdominal pain   Risk factors comment:  Cyclic vomiting   Past Medical History  Diagnosis Date  . GERD (gastroesophageal reflux disease)   . History of migraine   . Hypertension   . Cyclic vomiting syndrome   . Diverticulitis    Past Surgical History  Procedure Laterality Date  . Ectopic pregnancy surgery     Family History  Problem Relation Age of Onset  . Migraines Mother   . Diabetes Father    History  Substance Use Topics  . Smoking status: Current Every Day Smoker -- 0.50 packs/day    Types: Cigarettes  . Smokeless tobacco: Not on file  . Alcohol Use: No     Comment: occasional   OB History   Grav Para Term Preterm Abortions TAB SAB Ect Mult Living   3    3  1 2        Review of Systems  Gastrointestinal: Positive for nausea, vomiting and abdominal pain.  All other systems reviewed and are negative.     Allergies  Aspirin; Ciprofloxacin; and Ibuprofen  Home Medications   Prior to Admission medications   Medication Sig Start Date End Date Taking? Authorizing Provider  citalopram (CELEXA) 20 MG tablet Take 20 mg by mouth daily.   Yes Historical Provider, MD  HYDROXYZINE PAMOATE PO Take 50 mg by mouth at bedtime as needed. For sleeplessness   Yes Historical Provider, MD  omeprazole (PRILOSEC) 40 MG capsule Take 40 mg by mouth daily.   Yes Historical Provider, MD  LORazepam (ATIVAN) 1 MG tablet Take 1 tablet (1 mg total) by mouth every 6 (six) hours as needed for  anxiety. 04/12/14   Mora BellmanHannah S Merrell, PA-C  promethazine (PHENERGAN) 25 MG suppository Place 1 suppository (25 mg total) rectally every 6 (six) hours as needed for nausea or vomiting. 04/12/14   Mora BellmanHannah S Merrell, PA-C   BP 133/89  Pulse 69  Temp(Src) 98.3 F (36.8 C) (Oral)  Resp 18  SpO2 96% Physical Exam  Nursing note and vitals reviewed. Constitutional: She appears well-developed.  HENT:  Head: Normocephalic.  Eyes: Pupils are equal, round, and reactive to light.  Neck: Normal range of motion.  Cardiovascular: Normal rate and regular rhythm.   Pulmonary/Chest: Effort normal and breath sounds normal.  Abdominal: Soft. She exhibits no distension. There is generalized tenderness.  Musculoskeletal: Normal range of motion.  Neurological: She is alert.  Skin: Skin is warm.    ED Course  Procedures (including critical care time) Labs Review Labs Reviewed  CBC WITH DIFFERENTIAL - Abnormal; Notable for the following:    WBC 11.7 (*)    Hemoglobin 9.9 (*)    HCT 32.5 (*)    MCV 76.5 (*)    MCH 23.3 (*)    RDW 16.6 (*)    Platelets 407 (*)    Neutrophils Relative % 82 (*)    Neutro Abs 9.6 (*)    All other components within normal limits  I-STAT CHEM 8, ED - Abnormal; Notable for the following:  Glucose, Bld 124 (*)    Hemoglobin 11.9 (*)    HCT 35.0 (*)    All other components within normal limits    Imaging Review No results found.   EKG Interpretation None     Will be given IV fluids and IV Ativan MDM   Final diagnoses:  Non-intractable cyclical vomiting with nausea         Arman FilterGail K Sudais Banghart, NP 04/27/14 2001

## 2014-04-28 NOTE — ED Provider Notes (Signed)
Medical screening examination/treatment/procedure(s) were performed by non-physician practitioner and as supervising physician I was immediately available for consultation/collaboration.   EKG Interpretation None        Nazli Penn, MD 04/28/14 1411 

## 2014-05-18 ENCOUNTER — Encounter (HOSPITAL_COMMUNITY): Payer: Self-pay | Admitting: Emergency Medicine

## 2014-08-02 ENCOUNTER — Emergency Department (HOSPITAL_BASED_OUTPATIENT_CLINIC_OR_DEPARTMENT_OTHER)
Admission: EM | Admit: 2014-08-02 | Discharge: 2014-08-03 | Disposition: A | Discharge status: Home / Self Care | Payer: Self-pay | Attending: Emergency Medicine | Admitting: Emergency Medicine

## 2014-08-02 ENCOUNTER — Encounter (HOSPITAL_BASED_OUTPATIENT_CLINIC_OR_DEPARTMENT_OTHER): Payer: Self-pay | Admitting: *Deleted

## 2014-08-02 DIAGNOSIS — Z87891 Personal history of nicotine dependence: Secondary | ICD-10-CM | POA: Insufficient documentation

## 2014-08-02 DIAGNOSIS — K219 Gastro-esophageal reflux disease without esophagitis: Secondary | ICD-10-CM | POA: Insufficient documentation

## 2014-08-02 DIAGNOSIS — Z79899 Other long term (current) drug therapy: Secondary | ICD-10-CM | POA: Insufficient documentation

## 2014-08-02 DIAGNOSIS — I10 Essential (primary) hypertension: Secondary | ICD-10-CM | POA: Insufficient documentation

## 2014-08-02 DIAGNOSIS — R1115 Cyclical vomiting syndrome unrelated to migraine: Secondary | ICD-10-CM

## 2014-08-02 DIAGNOSIS — R109 Unspecified abdominal pain: Secondary | ICD-10-CM | POA: Insufficient documentation

## 2014-08-02 DIAGNOSIS — G43A0 Cyclical vomiting, not intractable: Secondary | ICD-10-CM | POA: Insufficient documentation

## 2014-08-02 DIAGNOSIS — Z3202 Encounter for pregnancy test, result negative: Secondary | ICD-10-CM | POA: Insufficient documentation

## 2014-08-02 LAB — CBC
HCT: 32.7 % — ABNORMAL LOW (ref 36.0–46.0)
Hemoglobin: 9.9 g/dL — ABNORMAL LOW (ref 12.0–15.0)
MCH: 22.9 pg — AB (ref 26.0–34.0)
MCHC: 30.3 g/dL (ref 30.0–36.0)
MCV: 75.7 fL — ABNORMAL LOW (ref 78.0–100.0)
PLATELETS: 411 10*3/uL — AB (ref 150–400)
RBC: 4.32 MIL/uL (ref 3.87–5.11)
RDW: 17.7 % — ABNORMAL HIGH (ref 11.5–15.5)
WBC: 11.4 10*3/uL — ABNORMAL HIGH (ref 4.0–10.5)

## 2014-08-02 LAB — PREGNANCY, URINE: Preg Test, Ur: NEGATIVE

## 2014-08-02 LAB — URINALYSIS, ROUTINE W REFLEX MICROSCOPIC
BILIRUBIN URINE: NEGATIVE
GLUCOSE, UA: NEGATIVE mg/dL
Ketones, ur: NEGATIVE mg/dL
Leukocytes, UA: NEGATIVE
NITRITE: NEGATIVE
PH: 7.5 (ref 5.0–8.0)
PROTEIN: NEGATIVE mg/dL
SPECIFIC GRAVITY, URINE: 1.018 (ref 1.005–1.030)
UROBILINOGEN UA: 0.2 mg/dL (ref 0.0–1.0)

## 2014-08-02 LAB — COMPREHENSIVE METABOLIC PANEL
ALK PHOS: 72 U/L (ref 39–117)
ALT: 13 U/L (ref 0–35)
AST: 20 U/L (ref 0–37)
Albumin: 4.8 g/dL (ref 3.5–5.2)
Anion gap: 9 (ref 5–15)
BUN: 8 mg/dL (ref 6–23)
CO2: 24 mmol/L (ref 19–32)
Calcium: 9 mg/dL (ref 8.4–10.5)
Chloride: 101 mEq/L (ref 96–112)
Creatinine, Ser: 0.74 mg/dL (ref 0.50–1.10)
Glucose, Bld: 154 mg/dL — ABNORMAL HIGH (ref 70–99)
POTASSIUM: 3.4 mmol/L — AB (ref 3.5–5.1)
SODIUM: 134 mmol/L — AB (ref 135–145)
Total Bilirubin: 0.9 mg/dL (ref 0.3–1.2)
Total Protein: 8.5 g/dL — ABNORMAL HIGH (ref 6.0–8.3)

## 2014-08-02 LAB — LIPASE, BLOOD: Lipase: 23 U/L (ref 11–59)

## 2014-08-02 LAB — URINE MICROSCOPIC-ADD ON

## 2014-08-02 MED ORDER — HYDROMORPHONE HCL 1 MG/ML IJ SOLN
1.0000 mg | Freq: Once | INTRAMUSCULAR | Status: AC
  Administered 2014-08-02: 1 mg via INTRAVENOUS
  Filled 2014-08-02: qty 1

## 2014-08-02 MED ORDER — LORAZEPAM 2 MG/ML IJ SOLN
1.0000 mg | Freq: Once | INTRAMUSCULAR | Status: AC
Start: 2014-08-02 — End: 2014-08-02
  Administered 2014-08-02: 1 mg via INTRAVENOUS
  Filled 2014-08-02: qty 1

## 2014-08-02 MED ORDER — PROMETHAZINE HCL 25 MG PO TABS: 25.0000 mg | ORAL_TABLET | Freq: Four times a day (QID) | ORAL | Status: DC | PRN

## 2014-08-02 MED ORDER — SODIUM CHLORIDE 0.9 % IV BOLUS (SEPSIS)
1000.0000 mL | Freq: Once | INTRAVENOUS | Status: AC
  Administered 2014-08-02: 1000 mL via INTRAVENOUS

## 2014-08-02 MED ORDER — ONDANSETRON HCL 4 MG/2ML IJ SOLN
4.0000 mg | Freq: Once | INTRAMUSCULAR | Status: AC
  Administered 2014-08-02: 4 mg via INTRAVENOUS
  Filled 2014-08-02: qty 2

## 2014-08-02 MED ORDER — LORAZEPAM 1 MG PO TABS: 1.0000 mg | ORAL_TABLET | Freq: Three times a day (TID) | ORAL | Status: DC | PRN

## 2014-08-02 MED ORDER — PROMETHAZINE HCL 25 MG RE SUPP: 25.0000 mg | Freq: Four times a day (QID) | RECTAL | Status: DC | PRN

## 2014-08-02 MED ORDER — PROMETHAZINE HCL 25 MG/ML IJ SOLN
25.0000 mg | Freq: Once | INTRAMUSCULAR | Status: AC
  Administered 2014-08-02: 25 mg via INTRAVENOUS
  Filled 2014-08-02: qty 1

## 2014-08-02 NOTE — Discharge Instructions (Signed)
Return to the ED with any concerns including vomiting and not able to keep down liquids, worsening abdominal pain, fever/chills, decreased level of alertness/lethargy, or any other alarming symptoms °

## 2014-08-02 NOTE — ED Provider Notes (Signed)
CSN: [id number]     Arrival date & time 08/02/14  2049 History  This chart was scribed for Shari Chick, MD by Abel Presto, ED Scribe. This patient was seen in room MH09/MH09 and the patient's care was started at 9:17 PM.      Chief Complaint  Patient presents with  . Emesis     Patient is a 39 y.o. female presenting with vomiting. The history is provided by the patient. No language interpreter was used.  Emesis Associated symptoms: abdominal pain   Associated symptoms: no diarrhea     HPI Comments: Shari Weeks is a 39 y.o. female who presents to the Emergency Department complaining of constant vomiting with onset today. Pt notes associated nausea, abdominal cramping and left sided abdominal pain. Pt dry heaving in exam room. Pt states symptoms began earlier today.  Emesis is nonbloody and nonobilious.  Abdominal pain is constant.  Symptoms are similar to prior episodes of cyclic vomiting. Pt denies diarrhea.   Past Medical History  Diagnosis Date  . GERD (gastroesophageal reflux disease)   . History of migraine   . Hypertension   . Cyclic vomiting syndrome   . Diverticulitis    Past Surgical History  Procedure Laterality Date  . Ectopic pregnancy surgery     Family History  Problem Relation Age of Onset  . Migraines Mother   . Diabetes Father    History  Substance Use Topics  . Smoking status: Former Smoker -- 0.50 packs/day    Types: Cigarettes  . Smokeless tobacco: Not on file  . Alcohol Use: No     Comment: occasional   OB History    Gravida Para Term Preterm AB TAB SAB Ectopic Multiple Living   Review of Systems  Gastrointestinal: Positive for nausea, vomiting and abdominal pain. Negative for diarrhea.  All other systems reviewed and are negative.     Allergies  Aspirin; Ciprofloxacin; and Ibuprofen  Home Medications   Prior to Admission medications   Medication Sig Start Date End Date Taking? Authorizing Provider   citalopram (CELEXA) 20 MG tablet Take 20 mg by mouth daily.   Yes Historical Provider, MD  HYDROXYZINE PAMOATE PO Take 50 mg by mouth at bedtime as needed. For sleeplessness   Yes Historical Provider, MD  omeprazole (PRILOSEC) 40 MG capsule Take 40 mg by mouth daily.   Yes Historical Provider, MD  LORazepam (ATIVAN) 1 MG tablet Take 1 tablet (1 mg total) by mouth 3 (three) times daily as needed for anxiety. 08/02/14   Shari Chick, MD  promethazine (PHENERGAN) 25 MG suppository Place 1 suppository (25 mg total) rectally every 6 (six) hours as needed for nausea or vomiting. 08/02/14   Shari Chick, MD  promethazine (PHENERGAN) 25 MG tablet Take 1 tablet (25 mg total) by mouth every 6 (six) hours as needed for nausea or vomiting. 08/02/14   Shari Chick, MD   BP 126/95 mmHg  Pulse 71  Temp(Src) 98 F (36.7 C) (Oral)  Resp 16  Ht  (1.803 m)  Wt 180 lb (81.647 kg)  BMI 25.12 kg/m2  SpO2 100%  LMP 07/12/2014 Physical Exam  Constitutional: She is oriented to person, place, and time. She appears well-developed and well-nourished.  HENT:  Head: Normocephalic.  Eyes: Conjunctivae are normal.  Neck: Normal range of motion. Neck supple.  Pulmonary/Chest: Effort normal.  Musculoskeletal: Normal range of motion.  Neurological:  She is alert and oriented to person, place, and time.  Skin: Skin is warm and dry.  Psychiatric: She has a normal mood and affect. Her behavior is normal.  Nursing note and vitals reviewed. Note- awake, alert, uncomfortable appearing, actively vomiting, abdomen- soft, diffusely tender to palpation, nabs, ND, no gaurding or rebound tenderness, lungs CTAB, BSS, CV- RRR, no murmur/rubs/gallops, MMM- moist, OP clear  ED Course  Procedures (including critical care time) DIAGNOSTIC STUDIES: Oxygen Saturation is 99% on room air, normal by my interpretation.    COORDINATION OF CARE: 9:18 PM Discussed treatment plan with patient at beside, the patient agrees with  the plan and has no further questions at this time.  10:22 PM of note, patient is having no wretching or vomiting while nurses are attempting to start IV line.  Urinalysis is not c/w dehydration.    11:08 PM on recheck pt is much more comfortable appearing.  She states her pain is down to a manageable level but would like more nausea medication.     Labs Review Labs Reviewed  URINALYSIS, ROUTINE W REFLEX MICROSCOPIC - Abnormal; Notable for the following:    Hgb urine dipstick MODERATE (*)    All other components within normal limits  CBC - Abnormal; Notable for the following:    WBC 11.4 (*)    Hemoglobin 9.9 (*)    HCT 32.7 (*)    MCV 75.7 (*)    MCH 22.9 (*)    RDW 17.7 (*)    Platelets 411 (*)    All other components within normal limits  COMPREHENSIVE METABOLIC PANEL - Abnormal; Notable for the following:    Sodium 134 (*)    Potassium 3.4 (*)    Glucose, Bld 154 (*)    Total Protein 8.5 (*)    All other components within normal limits  PREGNANCY, URINE  LIPASE, BLOOD  URINE MICROSCOPIC-ADD ON    Imaging Review No results found.   EKG Interpretation None      MDM   Final diagnoses:  Cyclical vomiting, not intractable   Pt presenting with acute onset of vomiting, abdominal cramping similar to prior episodes of cyclic vomiting. Pt with reassuring labs, urine does not indicate significant dehydration.  Pt treated with IV fluids, pain and nausea meds.  She feels improved prior to discharge and feels her symptoms are manageable at home.  Encouraged to arrange for followup with PMD and with GI.  Discharged with strict return precautions.  Pt agreeable with plan.    I personally performed the services described in this documentation, which was scribed in my presence. The recorded information has been reviewed and is accurate.     Shari ChickMartha K Linker, MD 08/04/14 [phone]

## 2014-08-02 NOTE — ED Notes (Signed)
Reports n/v since 2 pm "more than I can count"- vomiting in triage

## 2014-08-17 ENCOUNTER — Other Ambulatory Visit (HOSPITAL_COMMUNITY): Payer: Self-pay | Admitting: *Deleted

## 2014-08-17 DIAGNOSIS — N6320 Unspecified lump in the left breast, unspecified quadrant: Secondary | ICD-10-CM

## 2014-08-22 ENCOUNTER — Encounter (HOSPITAL_COMMUNITY): Payer: Self-pay | Admitting: Oncology

## 2014-08-22 ENCOUNTER — Encounter (HOSPITAL_COMMUNITY)
Admission: EM | Disposition: A | Discharge status: Home / Self Care | Payer: Self-pay | Source: Home / Self Care | Attending: Internal Medicine

## 2014-08-22 ENCOUNTER — Inpatient Hospital Stay (HOSPITAL_COMMUNITY)
Admission: EM | Admit: 2014-08-22 | Discharge: 2014-08-24 | DRG: 378 | Disposition: A | Discharge status: Home / Self Care | Payer: Self-pay | Attending: Internal Medicine | Admitting: Internal Medicine

## 2014-08-22 DIAGNOSIS — F1290 Cannabis use, unspecified, uncomplicated: Secondary | ICD-10-CM | POA: Diagnosis present

## 2014-08-22 DIAGNOSIS — K219 Gastro-esophageal reflux disease without esophagitis: Secondary | ICD-10-CM | POA: Diagnosis present

## 2014-08-22 DIAGNOSIS — K920 Hematemesis: Principal | ICD-10-CM | POA: Diagnosis present

## 2014-08-22 DIAGNOSIS — F419 Anxiety disorder, unspecified: Secondary | ICD-10-CM | POA: Diagnosis present

## 2014-08-22 DIAGNOSIS — G43A0 Cyclical vomiting, not intractable: Secondary | ICD-10-CM | POA: Diagnosis present

## 2014-08-22 DIAGNOSIS — R110 Nausea: Secondary | ICD-10-CM

## 2014-08-22 DIAGNOSIS — Z79899 Other long term (current) drug therapy: Secondary | ICD-10-CM

## 2014-08-22 DIAGNOSIS — D62 Acute posthemorrhagic anemia: Secondary | ICD-10-CM | POA: Diagnosis present

## 2014-08-22 DIAGNOSIS — R1115 Cyclical vomiting syndrome unrelated to migraine: Secondary | ICD-10-CM | POA: Diagnosis present

## 2014-08-22 DIAGNOSIS — Z87891 Personal history of nicotine dependence: Secondary | ICD-10-CM

## 2014-08-22 DIAGNOSIS — I10 Essential (primary) hypertension: Secondary | ICD-10-CM | POA: Diagnosis present

## 2014-08-22 DIAGNOSIS — Z833 Family history of diabetes mellitus: Secondary | ICD-10-CM

## 2014-08-22 DIAGNOSIS — Z888 Allergy status to other drugs, medicaments and biological substances status: Secondary | ICD-10-CM

## 2014-08-22 DIAGNOSIS — Z881 Allergy status to other antibiotic agents status: Secondary | ICD-10-CM

## 2014-08-22 HISTORY — PX: ESOPHAGOGASTRODUODENOSCOPY: SHX5428

## 2014-08-22 HISTORY — DX: Family history of other specified conditions: Z84.89

## 2014-08-22 LAB — CBC
HEMATOCRIT: 35.2 % — AB (ref 36.0–46.0)
HEMOGLOBIN: 10.8 g/dL — AB (ref 12.0–15.0)
MCH: 23.3 pg — ABNORMAL LOW (ref 26.0–34.0)
MCHC: 30.7 g/dL (ref 30.0–36.0)
MCV: 75.9 fL — ABNORMAL LOW (ref 78.0–100.0)
PLATELETS: 421 10*3/uL — AB (ref 150–400)
RBC: 4.64 MIL/uL (ref 3.87–5.11)
RDW: 16.9 % — AB (ref 11.5–15.5)
WBC: 12.6 10*3/uL — ABNORMAL HIGH (ref 4.0–10.5)

## 2014-08-22 LAB — TYPE AND SCREEN
ABO/RH(D): O POS
Antibody Screen: NEGATIVE

## 2014-08-22 LAB — CBC WITH DIFFERENTIAL/PLATELET
BASOS ABS: 0 10*3/uL (ref 0.0–0.1)
Basophils Relative: 0 % (ref 0–1)
EOS ABS: 0 10*3/uL (ref 0.0–0.7)
Eosinophils Relative: 0 % (ref 0–5)
HCT: 37.9 % (ref 36.0–46.0)
Hemoglobin: 11.5 g/dL — ABNORMAL LOW (ref 12.0–15.0)
Lymphocytes Relative: 21 % (ref 12–46)
Lymphs Abs: 2.5 10*3/uL (ref 0.7–4.0)
MCH: 23.1 pg — AB (ref 26.0–34.0)
MCHC: 30.3 g/dL (ref 30.0–36.0)
MCV: 76.3 fL — ABNORMAL LOW (ref 78.0–100.0)
Monocytes Absolute: 0.6 10*3/uL (ref 0.1–1.0)
Monocytes Relative: 5 % (ref 3–12)
NEUTROS ABS: 8.6 10*3/uL — AB (ref 1.7–7.7)
Neutrophils Relative %: 74 % (ref 43–77)
Platelets: 473 10*3/uL — ABNORMAL HIGH (ref 150–400)
RBC: 4.97 MIL/uL (ref 3.87–5.11)
RDW: 17.1 % — AB (ref 11.5–15.5)
WBC: 11.7 10*3/uL — ABNORMAL HIGH (ref 4.0–10.5)

## 2014-08-22 LAB — ABO/RH: ABO/RH(D): O POS

## 2014-08-22 LAB — COMPREHENSIVE METABOLIC PANEL
ALK PHOS: 77 U/L (ref 39–117)
ALT: 13 U/L (ref 0–35)
AST: 24 U/L (ref 0–37)
Albumin: 5.2 g/dL (ref 3.5–5.2)
Anion gap: 13 (ref 5–15)
BUN: 8 mg/dL (ref 6–23)
CALCIUM: 9.8 mg/dL (ref 8.4–10.5)
CO2: 23 mmol/L (ref 19–32)
Chloride: 100 mmol/L (ref 96–112)
Creatinine, Ser: 0.76 mg/dL (ref 0.50–1.10)
GFR calc Af Amer: >90 mL/min (ref >90)
GLUCOSE: 171 mg/dL — AB (ref 70–99)
Potassium: 3.4 mmol/L — ABNORMAL LOW (ref 3.5–5.1)
Sodium: 136 mmol/L (ref 135–145)
TOTAL PROTEIN: 9.1 g/dL — AB (ref 6.0–8.3)
Total Bilirubin: 1 mg/dL (ref 0.3–1.2)

## 2014-08-22 SURGERY — EGD (ESOPHAGOGASTRODUODENOSCOPY)
Anesthesia: Moderate Sedation

## 2014-08-22 MED ORDER — OXYCODONE HCL 5 MG PO TABS
5.0000 mg | ORAL_TABLET | ORAL | Status: DC | PRN
  Administered 2014-08-23 – 2014-08-24 (×5): 5 mg via ORAL
  Filled 2014-08-22 (×5): qty 1

## 2014-08-22 MED ORDER — INFLUENZA VAC SPLIT QUAD 0.5 ML IM SUSY
0.5000 mL | PREFILLED_SYRINGE | INTRAMUSCULAR | Status: AC
  Administered 2014-08-23: 0.5 mL via INTRAMUSCULAR
  Filled 2014-08-22 (×2): qty 0.5

## 2014-08-22 MED ORDER — FENTANYL CITRATE 0.05 MG/ML IJ SOLN
INTRAMUSCULAR | Status: AC
  Filled 2014-08-22: qty 2

## 2014-08-22 MED ORDER — METOCLOPRAMIDE HCL 5 MG/ML IJ SOLN
10.0000 mg | Freq: Once | INTRAMUSCULAR | Status: AC
  Administered 2014-08-22: 10 mg via INTRAVENOUS
  Filled 2014-08-22: qty 2

## 2014-08-22 MED ORDER — SODIUM CHLORIDE 0.9 % IV SOLN
8.0000 mg/h | INTRAVENOUS | Status: DC
  Administered 2014-08-22 – 2014-08-23 (×3): 8 mg/h via INTRAVENOUS
  Filled 2014-08-22 (×6): qty 80

## 2014-08-22 MED ORDER — SODIUM CHLORIDE 0.9 % IJ SOLN: 10.0000 mL | Freq: Two times a day (BID) | INTRAMUSCULAR | Status: DC

## 2014-08-22 MED ORDER — SODIUM CHLORIDE 0.9 % IV SOLN: INTRAVENOUS | Status: DC

## 2014-08-22 MED ORDER — MIDAZOLAM HCL 10 MG/2ML IJ SOLN
INTRAMUSCULAR | Status: AC
  Filled 2014-08-22: qty 2

## 2014-08-22 MED ORDER — HYDROMORPHONE HCL 1 MG/ML IJ SOLN
1.0000 mg | INTRAMUSCULAR | Status: DC | PRN
  Administered 2014-08-22 – 2014-08-23 (×7): 1 mg via INTRAVENOUS
  Filled 2014-08-22 (×7): qty 1

## 2014-08-22 MED ORDER — DIPHENHYDRAMINE HCL 50 MG/ML IJ SOLN
INTRAMUSCULAR | Status: AC
  Filled 2014-08-22: qty 1

## 2014-08-22 MED ORDER — ONDANSETRON HCL 4 MG/2ML IJ SOLN
4.0000 mg | Freq: Four times a day (QID) | INTRAMUSCULAR | Status: DC | PRN
Start: 2014-08-22 — End: 2014-08-24
  Administered 2014-08-22 – 2014-08-24 (×3): 4 mg via INTRAVENOUS
  Filled 2014-08-22 (×3): qty 2

## 2014-08-22 MED ORDER — HYDRALAZINE HCL 20 MG/ML IJ SOLN
10.0000 mg | Freq: Four times a day (QID) | INTRAMUSCULAR | Status: DC | PRN
  Administered 2014-08-22 – 2014-08-23 (×2): 10 mg via INTRAVENOUS
  Filled 2014-08-22 (×2): qty 1

## 2014-08-22 MED ORDER — PROMETHAZINE HCL 25 MG RE SUPP
25.0000 mg | Freq: Four times a day (QID) | RECTAL | Status: DC | PRN
Start: 2014-08-22 — End: 2014-08-22
  Filled 2014-08-22 (×2): qty 1

## 2014-08-22 MED ORDER — LORAZEPAM 2 MG/ML IJ SOLN: 1.0000 mg | INTRAMUSCULAR | Status: DC | PRN

## 2014-08-22 MED ORDER — SODIUM CHLORIDE 0.9 % IV SOLN
80.0000 mg | Freq: Once | INTRAVENOUS | Status: AC
  Administered 2014-08-22: 80 mg via INTRAVENOUS
  Filled 2014-08-22: qty 80

## 2014-08-22 MED ORDER — PROMETHAZINE HCL 25 MG/ML IJ SOLN
12.5000 mg | Freq: Four times a day (QID) | INTRAMUSCULAR | Status: DC | PRN
Start: 2014-08-22 — End: 2014-08-24
  Administered 2014-08-22: 25 mg via INTRAVENOUS
  Administered 2014-08-23: 12.5 mg via INTRAVENOUS
  Administered 2014-08-23 – 2014-08-24 (×2): 25 mg via INTRAVENOUS
  Filled 2014-08-22 (×4): qty 1

## 2014-08-22 MED ORDER — ONDANSETRON HCL 4 MG PO TABS: 4.0000 mg | ORAL_TABLET | Freq: Four times a day (QID) | ORAL | Status: DC | PRN

## 2014-08-22 MED ORDER — SODIUM CHLORIDE 0.9 % IV BOLUS (SEPSIS)
1000.0000 mL | Freq: Once | INTRAVENOUS | Status: AC
  Administered 2014-08-22: 1000 mL via INTRAVENOUS

## 2014-08-22 MED ORDER — DIPHENHYDRAMINE HCL 50 MG/ML IJ SOLN
INTRAMUSCULAR | Status: DC | PRN
  Administered 2014-08-22 (×2): 25 mg via INTRAVENOUS

## 2014-08-22 MED ORDER — HYDRALAZINE HCL 25 MG PO TABS
25.0000 mg | ORAL_TABLET | Freq: Three times a day (TID) | ORAL | Status: DC
  Administered 2014-08-22 – 2014-08-24 (×4): 25 mg via ORAL
  Filled 2014-08-22 (×8): qty 1

## 2014-08-22 MED ORDER — PROMETHAZINE HCL 25 MG RE SUPP
12.5000 mg | Freq: Four times a day (QID) | RECTAL | Status: DC | PRN
  Administered 2014-08-22: 25 mg via RECTAL
  Filled 2014-08-22: qty 1

## 2014-08-22 MED ORDER — HYDROMORPHONE HCL 1 MG/ML IJ SOLN
0.5000 mg | INTRAMUSCULAR | Status: DC | PRN
  Administered 2014-08-22: 1 mg via INTRAVENOUS
  Filled 2014-08-22: qty 1

## 2014-08-22 MED ORDER — LIDOCAINE VISCOUS 2 % MT SOLN
OROMUCOSAL | Status: DC | PRN
  Administered 2014-08-22: 1 via OROMUCOSAL

## 2014-08-22 MED ORDER — HYDROMORPHONE HCL 1 MG/ML IJ SOLN
1.0000 mg | INTRAMUSCULAR | Status: DC | PRN
Start: 2014-08-22 — End: 2014-08-22

## 2014-08-22 MED ORDER — POTASSIUM CHLORIDE IN NACL 40-0.9 MEQ/L-% IV SOLN
INTRAVENOUS | Status: DC
  Administered 2014-08-22 – 2014-08-23 (×2): 75 mL/h via INTRAVENOUS
  Filled 2014-08-22 (×4): qty 1000

## 2014-08-22 MED ORDER — MIDAZOLAM HCL 10 MG/2ML IJ SOLN
INTRAMUSCULAR | Status: DC | PRN
  Administered 2014-08-22 (×4): 2 mg via INTRAVENOUS

## 2014-08-22 MED ORDER — LIDOCAINE VISCOUS 2 % MT SOLN
OROMUCOSAL | Status: AC
  Filled 2014-08-22: qty 15

## 2014-08-22 MED ORDER — PROMETHAZINE HCL 25 MG PO TABS: 25.0000 mg | ORAL_TABLET | Freq: Four times a day (QID) | ORAL | Status: DC | PRN

## 2014-08-22 MED ORDER — PROMETHAZINE HCL 25 MG/ML IJ SOLN
25.0000 mg | Freq: Four times a day (QID) | INTRAMUSCULAR | Status: DC | PRN
Start: 2014-08-22 — End: 2014-08-22
  Filled 2014-08-22: qty 1

## 2014-08-22 MED ORDER — FENTANYL CITRATE 0.05 MG/ML IJ SOLN
INTRAMUSCULAR | Status: DC | PRN
  Administered 2014-08-22 (×4): 25 ug via INTRAVENOUS

## 2014-08-22 MED ORDER — PROMETHAZINE HCL 25 MG RE SUPP: 25.0000 mg | Freq: Four times a day (QID) | RECTAL | Status: DC | PRN

## 2014-08-22 MED ORDER — LORAZEPAM 2 MG/ML IJ SOLN
1.0000 mg | Freq: Once | INTRAMUSCULAR | Status: AC
  Administered 2014-08-22: 1 mg via INTRAVENOUS
  Filled 2014-08-22: qty 1

## 2014-08-22 MED ORDER — SODIUM CHLORIDE 0.9 % IJ SOLN
10.0000 mL | INTRAMUSCULAR | Status: DC | PRN
  Administered 2014-08-22 – 2014-08-24 (×4): 10 mL
  Filled 2014-08-22 (×4): qty 40

## 2014-08-22 MED ORDER — PNEUMOCOCCAL VAC POLYVALENT 25 MCG/0.5ML IJ INJ
0.5000 mL | INJECTION | INTRAMUSCULAR | Status: DC
  Filled 2014-08-22 (×3): qty 0.5

## 2014-08-22 MED ORDER — FENTANYL CITRATE 0.05 MG/ML IJ SOLN
100.0000 ug | Freq: Once | INTRAMUSCULAR | Status: AC
  Administered 2014-08-22: 100 ug via INTRAVENOUS
  Filled 2014-08-22: qty 2

## 2014-08-22 MED ORDER — PANTOPRAZOLE SODIUM 40 MG IV SOLR
40.0000 mg | Freq: Two times a day (BID) | INTRAVENOUS | Status: DC
Start: 2014-08-25 — End: 2014-08-23

## 2014-08-22 NOTE — ED Provider Notes (Signed)
CSN: [id number]     Arrival date & time 08/22/14  0228 History   First MD Initiated Contact with Patient 08/22/14 [phone]     Chief Complaint  Patient presents with  . Hematemesis     (Consider location/radiation/quality/duration/timing/severity/associated sxs/prior Treatment) HPI  This is a 39 year old female with history of cyclic vomiting syndrome. She is here with bloody emesis that began yesterday evening about 10:30. She has had multiple episodes. She has had witnessed hematemesis in the ED totaling about 600 milliliters. She is complaining of pain in her epigastrium which she rates as a 9 out of 10. It is worse with movement or palpation. She was given 4 milligrams of IM Zofran prior to arrival without significant relief. She has not had an endoscopy in about 5 years and has no current gastroenterologist.  Past Medical History  Diagnosis Date  . GERD (gastroesophageal reflux disease)   . History of migraine   . Hypertension   . Cyclic vomiting syndrome   . Diverticulitis    Past Surgical History  Procedure Laterality Date  . Ectopic pregnancy surgery     Family History  Problem Relation Age of Onset  . Migraines Mother   . Diabetes Father    History  Substance Use Topics  . Smoking status: Former Smoker -- 0.50 packs/day    Types: Cigarettes  . Smokeless tobacco: Not on file  . Alcohol Use: No     Comment: occasional   OB History    Gravida Para Term Preterm AB TAB SAB Ectopic Multiple Living   Review of Systems  All other systems reviewed and are negative.   Allergies  Aspirin; Ciprofloxacin; and Ibuprofen  Home Medications   Prior to Admission medications   Medication Sig Start Date End Date Taking? Authorizing Provider  citalopram (CELEXA) 20 MG tablet Take 20 mg by mouth daily.   Yes Historical Provider, MD  HYDROXYZINE PAMOATE PO Take 50 mg by mouth at bedtime as needed. For sleeplessness   Yes Historical Provider, MD  LORazepam  (ATIVAN) 1 MG tablet Take 1 tablet (1 mg total) by mouth 3 (three) times daily as needed for anxiety. 08/02/14  Yes Ethelda Chick, MD  omeprazole (PRILOSEC) 40 MG capsule Take 40 mg by mouth daily.   Yes Historical Provider, MD  promethazine (PHENERGAN) 25 MG tablet Take 1 tablet (25 mg total) by mouth every 6 (six) hours as needed for nausea or vomiting. 08/02/14  Yes Ethelda Chick, MD  promethazine (PHENERGAN) 25 MG suppository Place 1 suppository (25 mg total) rectally every 6 (six) hours as needed for nausea or vomiting. 08/02/14   Ethelda Chick, MD   BP 193/123 mmHg  Pulse 78  Temp(Src) 98.4 F (36.9 C) (Oral)  Resp 20  SpO2 100%  LMP 08/02/2014 (Approximate)   Physical Exam  General: Well-developed, well-nourished female in no acute distress; appearance consistent with age of record HENT: normocephalic; atraumatic; chronic appearing cyst below left eye Eyes: pupils equal, round and reactive to light; extraocular muscles intact Neck: supple Heart: regular rate and rhythm Lungs: clear to auscultation bilaterally Abdomen: soft; nondistended; epigastric tenderness; no masses or hepatosplenomegaly; bowel sounds present Extremities: No deformity; full range of motion; pulses normal Neurologic: Awake, alert and oriented; motor function intact in all extremities and symmetric; no facial droop Skin: Warm and dry Psychiatric: Anxious   ED Course  Procedures (including critical care time)  CRITICAL  CARE Performed by: Dallas Scorsone L Total critical care time: 60 minutes Critical care time was exclusive of separately billable procedures and treating other patients. Critical care was necessary to treat or prevent imminent or life-threatening deterioration. Critical care was time spent personally by me on the following activities: development of treatment plan with patient and/or surrogate as well as nursing, discussions with consultants, evaluation of patient's response to treatment,  examination of patient, obtaining history from patient or surrogate, ordering and performing treatments and interventions, ordering and review of laboratory studies, ordering and review of radiographic studies, pulse oximetry and re-evaluation of patient's condition.   MDM   Nursing notes and vitals signs, including pulse oximetry, reviewed.  Summary of this visit's results, reviewed by myself:  Labs:  Results for orders placed or performed during the hospital encounter of 08/22/14 (from the past 24 hour(s))  CBC with Differential     Status: Abnormal   Collection Time: 08/22/14  3:05 AM  Result Value Ref Range   WBC 11.7 (H) 4.0 - 10.5 K/uL   RBC 4.97 3.87 - 5.11 MIL/uL   Hemoglobin 11.5 (L) 12.0 - 15.0 g/dL   HCT 40.937.9 81.136.0 - 91.446.0 %   MCV 76.3 (L) 78.0 - 100.0 fL   MCH 23.1 (L) 26.0 - 34.0 pg   MCHC 30.3 30.0 - 36.0 g/dL   RDW 78.217.1 (H) 95.611.5 - 21.315.5 %   Platelets 473 (H) 150 - 400 K/uL   Neutrophils Relative % 74 43 - 77 %   Neutro Abs 8.6 (H) 1.7 - 7.7 K/uL   Lymphocytes Relative 21 12 - 46 %   Lymphs Abs 2.5 0.7 - 4.0 K/uL   Monocytes Relative 5 3 - 12 %   Monocytes Absolute 0.6 0.1 - 1.0 K/uL   Eosinophils Relative 0 0 - 5 %   Eosinophils Absolute 0.0 0.0 - 0.7 K/uL   Basophils Relative 0 0 - 1 %   Basophils Absolute 0.0 0.0 - 0.1 K/uL  Comprehensive metabolic panel     Status: Abnormal   Collection Time: 08/22/14  3:05 AM  Result Value Ref Range   Sodium 136 135 - 145 mmol/L   Potassium 3.4 (L) 3.5 - 5.1 mmol/L   Chloride 100 96 - 112 mmol/L   CO2 23 19 - 32 mmol/L   Glucose, Bld 171 (H) 70 - 99 mg/dL   BUN 8 6 - 23 mg/dL   Creatinine, Ser 0.860.76 0.50 - 1.10 mg/dL   Calcium 9.8 8.4 - 57.810.5 mg/dL   Total Protein 9.1 (H) 6.0 - 8.3 g/dL   Albumin 5.2 3.5 - 5.2 g/dL   AST 24 0 - 37 U/L   ALT 13 0 - 35 U/L   Alkaline Phosphatase 77 39 - 117 U/L   Total Bilirubin 1.0 0.3 - 1.2 mg/dL   GFR calc non Af Amer >90 >90 mL/min   GFR calc Af Amer >90 >90 mL/min   Anion gap 13 5  - 15   5:59 AM Multiple attempts were made by Antony Madura(Kelly Humes, PA-C and myself) to place a peripheral IV using bedside ultrasound without success. The IV nurse was eventually able to secure a peripheral IV in her left foot through which medications and gentle hydration were initiated. She will be admitted by the hospitalist service and gastroenterology has been consulted. A PICC line has been ordered.     Hanley SeamenJohn L Haruo Stepanek, MD 08/22/14 [phone]

## 2014-08-22 NOTE — Op Note (Signed)
[facility] [address] [place], [id number]   OPERATIVE PROCEDURE REPORT  PATIENT :Shari Weeks, [name]  MR#: [id number] [facility]1976-07-14 GENDER: female ENDOSCOPIST: Lorenza BurtonJyothi N Keanen Dohse, MD ASSISTANT:   Gar GibbonGuillaume Akawa, technician, Omelia BlackwaterShelby Carpenter, RN  Dwain SarnaPatricia Ford, RN. PROCEDURE DATE: 08/22/2014 PRE-PROCEDURE PREPERATION: Patient fasted for 8 hours prior to the procedure. PRE-PROCEDURE PHYSICAL: Patient has stable vital signs.  Neck is supple.  There is no JVD, thyromegaly or LAD.  Chest clear to auscultation.  S1 and S2 regular.  Abdomen soft, non-distended, non-tender with NABS. PROCEDURE:     EGD, diagnostic ASA CLASS:     Class II INDICATIONS:     1) Hematemesis 2) History of GERD 3) Nusea, and vomiting. MEDICATIONS:     Benadryl 50 mg, Fentanyl 100 mcg  & and Versed 10 mg IV TOPICAL ANESTHETIC:   Viscous xylocaine-10 cc PO.  DESCRIPTION OF PROCEDURE: After the risks benefits and alternatives of the procedure were thoroughly explained, informed consent was obtained. The Pentax video gastroscope [id number]  was introduced through the mouth and advanced to the second portion of the duodenum , without limitations. The instrument was slowly withdrawn as the mucosa was fully examined.      EXAM: The esophagus and gastroesophageal junction were completely normal in appearance.  The stomach was entered and closely examined.The antrum, angularis, and lesser curvature were well visualized, including a retroflexed view of the cardia and fundus. The stomach wall was normally distensable.  The scope passed easily through the pylorus into the duodenum. The patient was very combative during the procedure so the xam was ectremely limted. No old or fresh heme was noted in the upper GI tract.  IMPRESSION:  Normal appearing esophagus and GE junction, the stomach was well visualized and normal in appearance, normal appearing duodenum RECOMMENDATIONS:     1.   Anti-reflux regimen to be followed. 2.  Avoid NSAIDS. 3.  Continue PPI 's. 4. Needs a drug screen.  REPEAT EXAM:  no repeat exam necessary unless she rebleeds.  DISCHARGE INSTRUCTIONS: standard discharge instructions given.  _______________________________ eSignedLorenza Burton:  Kami Kube N Lore Polka, MD 08/22/2014 2:27 PM   CPT CODES:     [id number] Upper gastrointestinal endoscopy including esophagus, stomach, and either the duodenum and/or jejunum as appropriate; diagnostic, with or without collection of specimen(s) by brushing or washing (separate procedure) DIAGNOSIS CODES:     K92.0 Hematemesis Z87.19 Personal history of other diseases of the digestive system R11.0 Nausea R11.10 Vomiting,unspecified  The ICD and CPT codes recommended by this software are interpretations from the data that the clinical staff has captured with the software.  The verification of the translation of this report to the ICD and CPT codes and modifiers is the sole responsibility of the health care institution and practicing physician where this report was generated.  [employer] will not be held responsible for the validity of the ICD and CPT codes included on this report.  AMA assumes no liability for data contained or not contained herein. CPT is a Publishing rights managerregistered trademark of the [employer].   CC:  PATIENT NAME:  Shari Weeks, Aubrianna MR#: [id number]

## 2014-08-22 NOTE — H&P (Signed)
[facility] History and Physical  Shari Weeks ZOX:[id number] DOB: 07/17/1975 DOA: 08/22/2014  Referring physician: EDP PCP: Shari Weeks   Chief Complaint: Hematemesis   HPI: Shari Weeks is a 39 y.o. female with history of cyclic vomiting syndrome.  She developed vomiting earlier tonight as well as abdominal pain which is baseline for her cyclic vomiting syndrome attacks; however, after the 4th or 5th episode of vomiting, she developed frank hematemesis which she has never had before.  She wisely called EMS.  Patient had 100cc of dark red emesis with EMS, and has had at least 400cc more of dark red emesis since arrival to the ED at 2:30 AM.  Review of Systems: Systems reviewed.  As above, otherwise negative  Past Medical History  Diagnosis Date  . GERD (gastroesophageal reflux disease)   . History of migraine   . Hypertension   . Cyclic vomiting syndrome   . Diverticulitis    Past Surgical History  Procedure Laterality Date  . Ectopic pregnancy surgery     Social History:  reports that she has quit smoking. Her smoking use included Cigarettes. She smoked 0.50 packs per day. She does not have any smokeless tobacco history on file. She reports that she uses illicit drugs (Marijuana) about 7 times per week. She reports that she does not drink alcohol.  Allergies  Allergen Reactions  . Aspirin Other (See Comments)    The patient stated that she can not take this medication  . Ciprofloxacin Other (See Comments)    Unknown reaction  . Ibuprofen Other (See Comments)    Patient said she can not take this medication    Family History  Problem Relation Age of Onset  . Migraines Mother   . Diabetes Father      Prior to Admission medications   Medication Sig Start Date End Date Taking? Authorizing Provider  citalopram (CELEXA) 20 MG tablet Take 20 mg by mouth daily.   Yes Historical Provider, Weeks  HYDROXYZINE PAMOATE PO Take 50 mg by mouth at bedtime as needed. For  sleeplessness   Yes Historical Provider, Weeks  LORazepam (ATIVAN) 1 MG tablet Take 1 tablet (1 mg total) by mouth 3 (three) times daily as needed for anxiety. 08/02/14  Yes Ethelda ChickMartha K Linker, Weeks  omeprazole (PRILOSEC) 40 MG capsule Take 40 mg by mouth daily.   Yes Historical Provider, Weeks  promethazine (PHENERGAN) 25 MG tablet Take 1 tablet (25 mg total) by mouth every 6 (six) hours as needed for nausea or vomiting. 08/02/14  Yes Ethelda ChickMartha K Linker, Weeks  promethazine (PHENERGAN) 25 MG suppository Place 1 suppository (25 mg total) rectally every 6 (six) hours as needed for nausea or vomiting. 08/02/14   Ethelda ChickMartha K Linker, Weeks   Physical Exam: Filed Vitals:   08/22/14 0556  BP: 184/109  Pulse: 77  Temp:   Resp:     BP 184/109 mmHg  Pulse 77  Temp(Src) 98.4 F (36.9 C) (Oral)  Resp 20  SpO2 100%  LMP 08/02/2014 (Approximate)  General Appearance:    Alert, oriented, anxious, appears stated age  Head:    Normocephalic, atraumatic  Eyes:    PERRL, EOMI, sclera non-icteric        Nose:   Nares without drainage or epistaxis. Mucosa, turbinates normal  Throat:   Moist mucous membranes. Oropharynx without erythema or exudate.  Neck:   Supple. No carotid bruits.  No thyromegaly.  No lymphadenopathy.   Back:     No CVA tenderness, no  spinal tenderness  Lungs:     Clear to auscultation bilaterally, without wheezes, rhonchi or rales  Chest wall:    No tenderness to palpitation  Heart:    Regular rate and rhythm without murmurs, gallops, rubs  Abdomen:     Soft, diffuse tenderness, no rebound, no guarding, nondistended, normal bowel sounds, no organomegaly  Genitalia:    deferred  Rectal:    deferred  Extremities:   No clubbing, cyanosis or edema.  Pulses:   2+ and symmetric all extremities  Skin:   Skin color, texture, turgor normal, no rashes or lesions  Lymph nodes:   Cervical, supraclavicular, and axillary nodes normal  Neurologic:   CNII-XII intact. Normal strength, sensation and reflexes       throughout    Labs on Admission:  Basic Metabolic Panel:  Recent Labs Lab 08/22/14 0305  NA 136  K 3.4*  CL 100  CO2 23  GLUCOSE 171*  BUN 8  CREATININE 0.76  CALCIUM 9.8   Liver Function Tests:  Recent Labs Lab 08/22/14 0305  AST 24  ALT 13  ALKPHOS 77  BILITOT 1.0  PROT 9.1*  ALBUMIN 5.2   No results for input(s): LIPASE, AMYLASE in the last 168 hours. No results for input(s): AMMONIA in the last 168 hours. CBC:  Recent Labs Lab 08/22/14 0305  WBC 11.7*  NEUTROABS 8.6*  HGB 11.5*  HCT 37.9  MCV 76.3*  PLT 473*   Cardiac Enzymes: No results for input(s): CKTOTAL, CKMB, CKMBINDEX, TROPONINI in the last 168 hours.  BNP (last 3 results) No results for input(s): PROBNP in the last 8760 hours. CBG: No results for input(s): GLUCAP in the last 168 hours.  Radiological Exams on Admission: No results found.  EKG: Independently reviewed.  Assessment/Plan Principal Problem:   Hematemesis Active Problems:   Cyclic vomiting syndrome   Hematemesis with nausea   1. Hematemesis - likely mallory-weis tear given the history 1. IVF 2. PICC line for access ordered urgently given only one access in foot right now 3. Spoke with Dr. Loreta Ave, GI team will be ready between 8:30 and 9am, as soon as patient gets PICC she will get upper EGD 4. NPO 5. Initial HGB is 11.5 but this is after no IVF given, expect this to drop, repeat Hemoglobin at noon 2. CVS - unclear cause, may be an anxiety component contributing to this. 1. Dilaudid for pain 2. Ativan for anxiety 3. Phenergan for nausea, give PR until PICC in place then give IV    Code Status: Full Code  Family Communication: No family in room Disposition Plan: Admit to inpatient   Time spent: 70 min  Minnette Merida M. [facility] Pager [phone]  If 7AM-7PM, please contact the day team taking care of the patient [email or web address] Password [email or web address] 08/22/2014, 6:20 AM

## 2014-08-22 NOTE — ED Notes (Signed)
Per MD, hold on to phenergan IV inj. Until PICC or CVL inserted, can not be given tru PIV, pt. May received Phenergan Supp if needed if CVL access not available yet.

## 2014-08-22 NOTE — ED Notes (Signed)
Bed: ZO10WA04 Expected date: 08/22/14 Expected time: 2:13 AM Means of arrival: Ambulance Comments: vomiting

## 2014-08-22 NOTE — Progress Notes (Signed)
Peripherally Inserted Central Catheter/Midline Placement  The IV Nurse has discussed with the patient and/or persons authorized to consent for the patient, the purpose of this procedure and the potential benefits and risks involved with this procedure.  The benefits include less needle sticks, lab draws from the catheter and patient may be discharged home with the catheter.  Risks include, but not limited to, infection, bleeding, blood clot (thrombus formation), and puncture of an artery; nerve damage and irregular heat beat.  Alternatives to this procedure were also discussed.  PICC/Midline Placement Documentation  PICC / Midline Double Lumen 08/22/14 PICC Right Basilic 40 cm 0 cm (Active)  Indication for Insertion or Continuance of Line Limited venous access - need for IV therapy >5 days (PICC only) 08/22/2014  1:00 PM  Exposed Catheter (cm) 0 cm 08/22/2014  1:00 PM  Lumen #1 Status Flushed;Saline locked;Blood return noted 08/22/2014  1:00 PM  Lumen #2 Status Flushed;Saline locked;Blood return noted 08/22/2014  1:00 PM  Dressing Change Due 08/29/14 08/22/2014  1:00 PM       Ethelda ChickCurrie, Leva Baine Robert 08/22/2014, 1:10 PM

## 2014-08-22 NOTE — ED Notes (Addendum)
Per EMS pt c/o hemataemesis since 2230.  Pt given 4mg  IM zofran en route.  Pt seen at [facility] for the same on Monday.  Pt presents w/ 100 ml of dark red emesis from transport.  Since arrival pt has had an additional 300 ml of dark red emesis.

## 2014-08-22 NOTE — ED Notes (Signed)
Bed: WA03 Expected date:  Expected time:  Means of arrival:  Comments: 

## 2014-08-22 NOTE — ED Notes (Signed)
IV access attempted twice, unsuccessful 

## 2014-08-22 NOTE — Progress Notes (Signed)
Pt admitted after midnight. Please see earlier admission note by Dr. Julian ReilGardner. Pt admitted with hematemesis. Keep NPO. Plan for EGD today. Appreciate Dr. Loreta AveMann input.  Debbora PrestoMAGICK-Aayliah Rotenberry, MD  [facility] Pager [phone]  If 7PM-7AM, please contact night-coverage [email or web address] Password [email or web address]

## 2014-08-22 NOTE — Consult Note (Signed)
Cross cover [facility] Reason for Consult: Hematemesis in a patient with cyclic nausea and vomiting. Referring Physician: [facility]-Dr. Alcario Drought.  Shari Weeks is an 39 y.o. female.  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 She has had some epigastric pain. Denies any NSAID use or abuse. No history of dysphagia or odynophagia. Has very poor IV access so a PICC line has been ordered. She has a longstanding history of GERD. She last smoked marijuana a week ago.   Past Medical History  Diagnosis Date  . GERD (gastroesophageal reflux disease)   . History of migraine   . Hypertension   . Cyclic vomiting syndrome   . Diverticulitis    Past Surgical History  Procedure Laterality Date  . Ectopic pregnancy surgery     Family History  Problem Relation Age of Onset  . Migraines Mother   . Diabetes Father    Social History: Her smoking use included cigarettes. She smoked 0.50 packs per day. She reports that she uses illicit drugs (Marijuana) about 7 times per week. She reports that she does not drink alcohol.  Allergies:  Allergies  Allergen Reactions  . Aspirin Other (See Comments)    The patient stated that she can not take this medication  . Ciprofloxacin Other (See Comments)    Unknown reaction  . Ibuprofen Other (See Comments)    Patient said she can not take this medication   Medications: I have reviewed the patient's current medications.  Results for orders placed or performed during the hospital encounter of 08/22/14 (from the past 48 hour(s))  CBC with Differential     Status: Abnormal   Collection Time: 08/22/14  3:05 AM  Result Value Ref Range   WBC 11.7 (H) 4.0 - 10.5 K/uL   RBC 4.97 3.87 - 5.11 MIL/uL   Hemoglobin 11.5 (L) 12.0 - 15.0 g/dL   HCT 37.9 36.0 - 46.0 %   MCV 76.3 (L) 78.0 - 100.0 fL   MCH 23.1 (L) 26.0 - 34.0 pg   MCHC 30.3 30.0 - 36.0 g/dL    RDW 17.1 (H) 11.5 - 15.5 %   Platelets 473 (H) 150 - 400 K/uL   Neutrophils Relative % 74 43 - 77 %   Neutro Abs 8.6 (H) 1.7 - 7.7 K/uL   Lymphocytes Relative 21 12 - 46 %   Lymphs Abs 2.5 0.7 - 4.0 K/uL   Monocytes Relative 5 3 - 12 %   Monocytes Absolute 0.6 0.1 - 1.0 K/uL   Eosinophils Relative 0 0 - 5 %   Eosinophils Absolute 0.0 0.0 - 0.7 K/uL   Basophils Relative 0 0 - 1 %   Basophils Absolute 0.0 0.0 - 0.1 K/uL  Comprehensive metabolic panel     Status: Abnormal   Collection Time: 08/22/14  3:05 AM  Result Value Ref Range   Sodium 136 135 - 145 mmol/L   Potassium 3.4 (L) 3.5 - 5.1 mmol/L   Chloride 100 96 - 112 mmol/L   CO2 23 19 - 32 mmol/L   Glucose, Bld 171 (H) 70 - 99 mg/dL   BUN 8 6 - 23 mg/dL   Creatinine, Ser 0.76 0.50 - 1.10 mg/dL   Calcium 9.8 8.4 - 10.5 mg/dL   Total Protein 9.1 (H) 6.0 - 8.3 g/dL   Albumin 5.2 3.5 - 5.2 g/dL   AST 24 0 - 37 U/L  ALT 13 0 - 35 U/L   Alkaline Phosphatase 77 39 - 117 U/L   Total Bilirubin 1.0 0.3 - 1.2 mg/dL   GFR calc non Af Amer >90 >90 mL/min   GFR calc Af Amer >90 >90 mL/min    Comment: (NOTE) The eGFR has been calculated using the CKD EPI equation. This calculation has not been validated in all clinical situations. eGFR's persistently <90 mL/min signify possible Chronic Kidney Disease.    Anion gap 13 5 - 15  Type and screen     Status: None   Collection Time: 08/22/14  3:15 AM  Result Value Ref Range   ABO/RH(D) O POS    Antibody Screen NEG    Sample Expiration 08/25/2014   CBC     Status: Abnormal   Collection Time: 08/22/14  8:54 AM  Result Value Ref Range   WBC 12.6 (H) 4.0 - 10.5 K/uL   RBC 4.64 3.87 - 5.11 MIL/uL   Hemoglobin 10.8 (L) 12.0 - 15.0 g/dL   HCT 35.2 (L) 36.0 - 46.0 %   MCV 75.9 (L) 78.0 - 100.0 fL   MCH 23.3 (L) 26.0 - 34.0 pg   MCHC 30.7 30.0 - 36.0 g/dL   RDW 16.9 (H) 11.5 - 15.5 %   Platelets 421 (H) 150 - 400 K/uL   Review of Systems  Constitutional: Positive for malaise/fatigue.  Negative for fever, chills, weight loss and diaphoresis.  HENT: Negative.   Eyes: Negative.   Respiratory: Negative.   Cardiovascular: Negative.   Gastrointestinal: Positive for heartburn, nausea and vomiting. Negative for abdominal pain, diarrhea, constipation, blood in stool and melena.  Genitourinary: Negative.   Musculoskeletal: Negative.   Skin: Negative.   Neurological: Positive for weakness.  Endo/Heme/Allergies: Negative.   Psychiatric/Behavioral: Negative.    Blood pressure 185/98, pulse 93, temperature 98.7 F (37.1 C), temperature source Oral, resp. rate 18, height $RemoveBe'5\' 11"'lOzeTTxAF$  (1.803 m), weight 75.297 kg (166 lb), last menstrual period 08/02/2014, SpO2 100 %. Physical Exam  Constitutional: She is oriented to person, place, and time. She appears well-developed and well-nourished.  HENT:  Head: Normocephalic and atraumatic.  ?sebaceous cyst on face close to the medial aspect of the left eye  Eyes: Conjunctivae and EOM are normal.  Neck: Normal range of motion. Neck supple.  Cardiovascular: Normal rate and regular rhythm.   Respiratory: Effort normal and breath sounds normal.  GI: Soft. Bowel sounds are normal.  Musculoskeletal: Normal range of motion.  Neurological: She is alert and oriented to person, place, and time.  Skin: Skin is warm and dry.  Psychiatric: She has a normal mood and affect. Her behavior is normal. Judgment and thought content normal.   Assessment/Plan: 1) Hematemesis with a history of cyclic vomiting syndrome/GERD: Will proceed with an EGD ASAP. Further recommendations will be made as needed after the EGD has been done.  2) History of migraine headaches.  3) Diverticulosis with a history of diverticulitis.  4) HTN.  Starlena Beil 08/22/2014, 9:19 AM

## 2014-08-23 LAB — BASIC METABOLIC PANEL
Anion gap: 9 (ref 5–15)
BUN: 7 mg/dL (ref 6–23)
CO2: 24 mmol/L (ref 19–32)
Calcium: 8.9 mg/dL (ref 8.4–10.5)
Chloride: 108 mmol/L (ref 96–112)
Creatinine, Ser: 0.81 mg/dL (ref 0.50–1.10)
GFR calc Af Amer: >90 mL/min (ref >90)
GFR calc non Af Amer: >90 mL/min (ref >90)
GLUCOSE: 117 mg/dL — AB (ref 70–99)
Potassium: 4 mmol/L (ref 3.5–5.1)
SODIUM: 141 mmol/L (ref 135–145)

## 2014-08-23 LAB — RAPID URINE DRUG SCREEN, HOSP PERFORMED
Amphetamines: NOT DETECTED
Barbiturates: NOT DETECTED
Benzodiazepines: POSITIVE — AB
Cocaine: NOT DETECTED
OPIATES: NOT DETECTED
Tetrahydrocannabinol: POSITIVE — AB

## 2014-08-23 LAB — CBC
HCT: 29.6 % — ABNORMAL LOW (ref 36.0–46.0)
HEMOGLOBIN: 9 g/dL — AB (ref 12.0–15.0)
MCH: 23.6 pg — ABNORMAL LOW (ref 26.0–34.0)
MCHC: 30.4 g/dL (ref 30.0–36.0)
MCV: 77.7 fL — ABNORMAL LOW (ref 78.0–100.0)
Platelets: 340 10*3/uL (ref 150–400)
RBC: 3.81 MIL/uL — AB (ref 3.87–5.11)
RDW: 17.4 % — AB (ref 11.5–15.5)
WBC: 10.6 10*3/uL — ABNORMAL HIGH (ref 4.0–10.5)

## 2014-08-23 MED ORDER — PANTOPRAZOLE SODIUM 40 MG PO TBEC
40.0000 mg | DELAYED_RELEASE_TABLET | Freq: Two times a day (BID) | ORAL | Status: DC
  Administered 2014-08-23 – 2014-08-24 (×2): 40 mg via ORAL
  Filled 2014-08-23 (×3): qty 1

## 2014-08-23 MED ORDER — BOOST / RESOURCE BREEZE PO LIQD
1.0000 | Freq: Three times a day (TID) | ORAL | Status: DC
  Administered 2014-08-23: 1 via ORAL

## 2014-08-23 NOTE — Progress Notes (Signed)
INITIAL NUTRITION ASSESSMENT  DOCUMENTATION CODES Per approved criteria  -Not Applicable   INTERVENTION: Provide Resource Breeze po TID, each supplement provides 250 kcal and 9 grams of protein Provided GERD and N/V Nutrition Therapy handouts Encourage PO intake  NUTRITION DIAGNOSIS: Unintentional weight loss related to N/V as evidenced by 8% weight loss x 3 weeks.   Goal: Pt to meet >/= 90% of their estimated nutrition needs   Monitor:  PO and supplemental intake, weight, labs, I/O's  Reason for Assessment: Pt identified as at nutrition risk on the Malnutrition Screen Tool  Admitting Dx: Hematemesis  ASSESSMENT: 39 y.o. female with history of cyclic vomiting syndrome. She developed vomiting earlier tonight as well as abdominal pain which is baseline for her cyclic vomiting syndrome attacks; however, after the 4th or 5th episode of vomiting, she developed frank hematemesis which she has never had before.   Pt reports experiencing cyclic vomiting x 10 years. Pt with GERD as well. Provided GERD and N/V nutrition therapy education and handouts.   Pt reports good appetite and eating okay despite N/V PTA. Pt has had some weight loss (8% weight loss x 3 weeks, significant for time frame). Pt reports changing her diet and exercising more PTA but weight loss is primarily d/t these symptoms.  Pt is willing to try Resource Breeze supplements. RD to order.  Nutrition focused physical exam shows no sign of depletion of muscle mass or body fat.  Labs reviewed: WNL  Height: Ht Readings from Last 1 Encounters:  08/22/14  (1.803 m)    Weight: Wt Readings from Last 1 Encounters:  08/22/14 166 lb (75.297 kg)    Ideal Body Weight: 155 lb  % Ideal Body Weight: 93%  Wt Readings from Last 10 Encounters:  08/22/14 166 lb (75.297 kg)  08/02/14 180 lb (81.647 kg)  03/07/13 190 lb (86.183 kg)  03/06/12 160 lb (72.576 kg)  08/18/11 174 lb 12.8 oz (79.289 kg)  08/16/11 170 lb  (77.111 kg)    Usual Body Weight: 211 lb -per pt  % Usual Body Weight: 79%  BMI:  Body mass index is 23.16 kg/(m^2).  Estimated Nutritional Needs: Kcal: 1850-2050 Protein: 90-100g Fluid: 1.9L/day  Skin: intact  Diet Order: Diet clear liquid  EDUCATION NEEDS: -Education needs addressed   Intake/Output Summary (Last 24 hours) at 08/23/14 0954 Last data filed at 08/23/14 0600  Gross per 24 hour  Intake 1792.08 ml  Output      0 ml  Net 1792.08 ml    Last BM: 2/6   Labs:   Recent Labs Lab 08/22/14 0305 08/23/14 0532  NA 136 141  K 3.4* 4.0  CL 100 108  CO2 23 24  BUN 8 7  CREATININE 0.76 0.81  CALCIUM 9.8 8.9  GLUCOSE 171* 117*    CBG (last 3)  No results for input(s): GLUCAP in the last 72 hours.  Scheduled Meds: . hydrALAZINE  25 mg Oral 3 times per day  . Influenza vac split quadrivalent PF  0.5 mL Intramuscular Tomorrow-1000  . [START ON 08/25/2014] pantoprazole (PROTONIX) IV  40 mg Intravenous Q12H  . pneumococcal 23 valent vaccine  0.5 mL Intramuscular Tomorrow-1000  . sodium chloride  10-40 mL Intracatheter Q12H    Continuous Infusions: . 0.9 % NaCl with KCl 40 mEq / L 75 mL/hr (08/23/14 0353)  . pantoprozole (PROTONIX) infusion 8 mg/hr (08/23/14 0356)    Past Medical History  Diagnosis Date  . GERD (gastroesophageal reflux disease)   .  History of migraine   . Hypertension   . Cyclic vomiting syndrome   . Diverticulitis   . Family history of adverse reaction to anesthesia     Mother has a hard time waking up    Past Surgical History  Procedure Laterality Date  . Ectopic pregnancy surgery      Tilda FrancoLindsey Breion Novacek, MS, RD, LDN Pager: [phone] After Hours Pager: [phone]

## 2014-08-23 NOTE — Progress Notes (Signed)
Pt still feeling slightly nauseated. Using aromatherapy peppermint to help with nausea and Alcohol swabs. Pt states booth are very helpful. Will continue to monitor.

## 2014-08-23 NOTE — Progress Notes (Addendum)
Unassigned patient Cross cover [facility] Subjective: Since I last evaluated the patient, she seems to be doing well. She denies having any more nausea, vomiting or abdominal pain. She has not had any further problems with hematemesis.   Objective: Vital signs in last 24 hours: Temp:  [98.4 F (36.9 C)-99 F (37.2 C)] 98.9 F (37.2 C) (02/07 0431) Pulse Rate:  [71-153] 73 (02/07 0431) Resp:  [10-23] 20 (02/07 0431) BP: (130-192)/(62-147) 137/98 mmHg (02/07 0431) SpO2:  [100 %] 100 % (02/07 0431) Last BM Date: 08/22/14  Intake/Output from previous day: 02/06 0701 - 02/07 0700 In: 1792.1 [I.V.:1792.1] Out: -  Intake/Output this shift:  General appearance: alert, cooperative, appears stated age and no distress Resp: clear to auscultation bilaterally Cardio: regular rate and rhythm, S1, S2 normal, no murmur, click, rub or gallop GI: soft, non-tender; bowel sounds normal; no masses,  no organomegaly Extremities: extremities normal, atraumatic, no cyanosis or edema  Lab Results:  Recent Labs  08/22/14 0305 08/22/14 0854 08/23/14 0532  WBC 11.7* 12.6* 10.6*  HGB 11.5* 10.8* 9.0*  HCT 37.9 35.2* 29.6*  PLT 473* 421* 340   BMET  Recent Labs  08/22/14 0305 08/23/14 0532  NA 136 141  K 3.4* 4.0  CL 100 108  CO2 23 24  GLUCOSE 171* 117*  BUN 8 7  CREATININE 0.76 0.81  CALCIUM 9.8 8.9   LFT  Recent Labs  08/22/14 0305  PROT 9.1*  ALBUMIN 5.2  AST 24  ALT 13  ALKPHOS 77  BILITOT 1.0   Studies/Results: No results found.  Medications: I have reviewed the patient's current medications.  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 Advance to a regular diet. Patient seems to be ready for discharge. 2) Hematemesis-resolved. Avoid use of all NSAIDS.  3) Anemia.  LOS: 1 day   Hyacinth Marcelli 08/23/2014, 11:33 AM

## 2014-08-23 NOTE — Progress Notes (Signed)
Patient ID: Shari Weeks, female   DOB: 04/17/1976, 39 y.o.   MRN: [id number]  [facility] PROGRESS NOTE  Shari Weeks [id number] DOB: 06/05/1976 DOA: 08/22/2014 PCP: Sydnee CabalSHEARIN,MARY D, MD   Brief narrative:    39 y.o. female with history of cyclic vomiting syndrome, presented with hematemesis, and abd pain.  Underwent EGD 2/6 by Dr. Loreta AveMann.   Assessment/Plan:    Principal Problem:   Hematemesis - normal EGD, possibly secondary to THC use - now resolved, stable for d/c by GI team, pt wants to go in AM - advance diet  - avoid NSAID's Active Problems:   Acute blood loss anemia - secondary to hematemesis - repeat CBC in AM   Cyclic vomiting syndrome - continue antiemetics as needed   SCD's for DVT prophylaxis   Code Status: Full.  Family Communication:  plan of care discussed with the patient Disposition Plan: Home in AM  IV access:  Peripheral IV  Procedures and diagnostic studies:    GD 2/6 --> normal EGD  Medical Consultants:  GI  Other Consultants:  None    IAnti-Infectives:   None   Debbora PrestoMAGICK-Pailyn Bellevue, MD  [facility] Pager [phone]  If 7PM-7AM, please contact night-coverage [email or web address] Password [email or web address] 08/23/2014, 2:07 PM   LOS: 1 day   HPI/Subjective: No events overnight.   Objective: Filed Vitals:   08/22/14 1500 08/22/14 2029 08/23/14 0431 08/23/14 1315  BP: 130/62 138/96 137/98 140/88  Pulse: 84 71 73   Temp: 98.4 F (36.9 C) 99 F (37.2 C) 98.9 F (37.2 C)   TempSrc: Oral Oral Oral   Resp: 18 18 20    Height:      Weight:      SpO2: 100% 100% 100%     Intake/Output Summary (Last 24 hours) at 08/23/14 1407 Last data filed at 08/23/14 0600  Gross per 24 hour  Intake 1792.08 ml  Output      0 ml  Net 1792.08 ml    Exam:   General:  Pt is alert, follows commands appropriately, not in acute distress  Cardiovascular: Regular rate and rhythm, S1/S2, no murmurs, no rubs, no gallops  Respiratory: Clear to auscultation bilaterally, no  wheezing, no crackles, no rhonchi  Abdomen: Soft, non tender, non distended, bowel sounds present, no guarding  Extremities: No edema, pulses DP and PT palpable bilaterally  Neuro: Grossly nonfocal  Data Reviewed: Basic Metabolic Panel:  Recent Labs Lab 08/22/14 0305 08/23/14 0532  NA 136 141  K 3.4* 4.0  CL 100 108  CO2 23 24  GLUCOSE 171* 117*  BUN 8 7  CREATININE 0.76 0.81  CALCIUM 9.8 8.9   Liver Function Tests:  Recent Labs Lab 08/22/14 0305  AST 24  ALT 13  ALKPHOS 77  BILITOT 1.0  PROT 9.1*  ALBUMIN 5.2   CBC:  Recent Labs Lab 08/22/14 0305 08/22/14 0854 08/23/14 0532  WBC 11.7* 12.6* 10.6*  NEUTROABS 8.6*  --   --   HGB 11.5* 10.8* 9.0*  HCT 37.9 35.2* 29.6*  MCV 76.3* 75.9* 77.7*  PLT 473* 421* 340    Scheduled Meds: . feeding supplement (RESOURCE BREEZE)  1 Container Oral TID BM  . hydrALAZINE  25 mg Oral 3 times per day  . pantoprazole  40 mg Oral BID AC  . pneumococcal 23 valent vaccine  0.5 mL Intramuscular Tomorrow-1000  . sodium chloride  10-40 mL Intracatheter Q12H   Continuous Infusions: . 0.9 % NaCl with KCl 40 mEq / L 75 mL/hr (08/23/14  0353)          

## 2014-08-24 ENCOUNTER — Encounter (HOSPITAL_COMMUNITY): Payer: Self-pay | Admitting: Gastroenterology

## 2014-08-24 LAB — CBC
HCT: 30.2 % — ABNORMAL LOW (ref 36.0–46.0)
HEMOGLOBIN: 9 g/dL — AB (ref 12.0–15.0)
MCH: 23.1 pg — AB (ref 26.0–34.0)
MCHC: 29.8 g/dL — AB (ref 30.0–36.0)
MCV: 77.4 fL — AB (ref 78.0–100.0)
PLATELETS: 325 10*3/uL (ref 150–400)
RBC: 3.9 MIL/uL (ref 3.87–5.11)
RDW: 17.1 % — AB (ref 11.5–15.5)
WBC: 8.8 10*3/uL (ref 4.0–10.5)

## 2014-08-24 LAB — BASIC METABOLIC PANEL
Anion gap: 6 (ref 5–15)
BUN: 6 mg/dL (ref 6–23)
CHLORIDE: 107 mmol/L (ref 96–112)
CO2: 24 mmol/L (ref 19–32)
Calcium: 8.6 mg/dL (ref 8.4–10.5)
Creatinine, Ser: 0.71 mg/dL (ref 0.50–1.10)
GFR calc Af Amer: >90 mL/min (ref >90)
Glucose, Bld: 96 mg/dL (ref 70–99)
Potassium: 3.5 mmol/L (ref 3.5–5.1)
Sodium: 137 mmol/L (ref 135–145)

## 2014-08-24 MED ORDER — LORAZEPAM 1 MG PO TABS: 1.0000 mg | ORAL_TABLET | Freq: Three times a day (TID) | ORAL | Status: DC | PRN

## 2014-08-24 MED ORDER — OXYCODONE HCL 5 MG PO TABS: 5.0000 mg | ORAL_TABLET | ORAL | Status: DC | PRN

## 2014-08-24 MED ORDER — METOPROLOL TARTRATE 25 MG PO TABS: 50.0000 mg | ORAL_TABLET | Freq: Two times a day (BID) | ORAL | Status: DC

## 2014-08-24 MED ORDER — PROMETHAZINE HCL 25 MG RE SUPP: 25.0000 mg | Freq: Four times a day (QID) | RECTAL | Status: DC | PRN

## 2014-08-24 MED ORDER — PANTOPRAZOLE SODIUM 40 MG PO TBEC: 40.0000 mg | DELAYED_RELEASE_TABLET | Freq: Every day | ORAL | Status: DC

## 2014-08-24 MED ORDER — PROMETHAZINE HCL 25 MG PO TABS: 25.0000 mg | ORAL_TABLET | Freq: Four times a day (QID) | ORAL | Status: DC | PRN

## 2014-08-24 NOTE — Discharge Summary (Signed)
Physician Discharge Summary  Shari Weeks [id number] DOB: May 28, 1976 DOA: 08/22/2014  PCP: Sydnee Cabal, MD  Admit date: 08/22/2014 Discharge date: 08/24/2014  Recommendations for Outpatient Follow-up:  1. Pt will need to follow up with PCP in 2-3 weeks post discharge 2. Please obtain BMP to evaluate electrolytes and kidney function 3. Please also check CBC to evaluate Hg and Hct levels  Discharge Diagnoses:  Principal Problem:   Hematemesis Active Problems:   Cyclic vomiting syndrome   Hematemesis with nausea  Discharge Condition: Stable  Diet recommendation: Heart healthy diet discussed in details     Brief narrative:    39 y.o. female with history of cyclic vomiting syndrome, presented with hematemesis, and abd pain. Underwent EGD 2/6 by Dr. Loreta Ave.   Assessment/Plan:    Principal Problem:  Hematemesis - normal EGD, possibly secondary to THC use - now resolved, stable for d/c by GI team - avoid NSAID's Active Problems:  Acute blood loss anemia - secondary to hematemesis - Hg remains stable   Cyclic vomiting syndrome - continue antiemetics as needed   SCD's for DVT prophylaxis provided while pt hospitalized  Code Status: Full.  Family Communication: plan of care discussed with the patient Disposition Plan: Home  IV access:  Peripheral IV  Procedures and diagnostic studies:   GD 2/6 --> normal EGD  Medical Consultants:  GI  Other Consultants:  None   IAnti-Infectives:   None        Discharge Exam: Filed Vitals:   08/24/14 0434  BP: 139/88  Pulse: 69  Temp: 99 F (37.2 C)  Resp: 20   Filed Vitals:   08/23/14 1418 08/23/14 1950 08/23/14 2006 08/24/14 0434  BP: 136/84 158/102 156/92 139/88  Pulse: 83  87 69  Temp: 98.5 F (36.9 C) 98.7 F (37.1 C)  99 F (37.2 C)  TempSrc: Oral Oral  Oral  Resp: Height:      Weight:      SpO2: 100% 100% 100% 100%    General: Pt is alert, follows  commands appropriately, not in acute distress Cardiovascular: Regular rate and rhythm, S1/S2 +, no murmurs, no rubs, no gallops Respiratory: Clear to auscultation bilaterally, no wheezing, no crackles, no rhonchi Abdominal: Soft, non tender, non distended, bowel sounds +, no guarding Extremities: no edema, no cyanosis, pulses palpable bilaterally DP and PT Neuro: Grossly nonfocal  Discharge Instructions  Discharge Instructions    Diet - low sodium heart healthy    Complete by:  As directed      Increase activity slowly    Complete by:  As directed             Medication List    TAKE these medications        citalopram 20 MG tablet  Commonly known as:  CELEXA  Take 20 mg by mouth daily.     HYDROXYZINE PAMOATE PO  Take 50 mg by mouth at bedtime as needed. For sleeplessness     LORazepam 1 MG tablet  Commonly known as:  ATIVAN  Take 1 tablet (1 mg total) by mouth 3 (three) times daily as needed for anxiety.     metoprolol tartrate 25 MG tablet  Commonly known as:  LOPRESSOR  Take 2 tablets (50 mg total) by mouth 2 (two) times daily.     omeprazole 40 MG capsule  Commonly known as:  PRILOSEC  Take 40 mg by mouth daily.     oxyCODONE 5 MG immediate release  tablet  Commonly known as:  Oxy IR/ROXICODONE  Take 1 tablet (5 mg total) by mouth every 4 (four) hours as needed for moderate pain.     pantoprazole 40 MG tablet  Commonly known as:  PROTONIX  Take 1 tablet (40 mg total) by mouth daily.     promethazine 25 MG tablet  Commonly known as:  PHENERGAN  Take 1 tablet (25 mg total) by mouth every 6 (six) hours as needed for nausea or vomiting.     promethazine 25 MG suppository  Commonly known as:  PHENERGAN  Place 1 suppository (25 mg total) rectally every 6 (six) hours as needed for nausea or vomiting.          The results of significant diagnostics from this hospitalization (including imaging, microbiology, ancillary and laboratory) are listed below for  reference.     Microbiology: No results found for this or any previous visit (from the past 240 hour(s)).   Labs: Basic Metabolic Panel:  Recent Labs Lab 08/22/14 0305 08/23/14 0532 08/24/14 0405  NA 136 141 137  K 3.4* 4.0 3.5  CL 100 108 107  CO2 23 24 24   GLUCOSE 171* 117* 96  BUN 8 7 6   CREATININE 0.76 0.81 0.71  CALCIUM 9.8 8.9 8.6   Liver Function Tests:  Recent Labs Lab 08/22/14 0305  AST 24  ALT 13  ALKPHOS 77  BILITOT 1.0  PROT 9.1*  ALBUMIN 5.2   CBC:  Recent Labs Lab 08/22/14 0305 08/22/14 0854 08/23/14 0532 08/24/14 0405  WBC 11.7* 12.6* 10.6* 8.8  NEUTROABS 8.6*  --   --   --   HGB 11.5* 10.8* 9.0* 9.0*  HCT 37.9 35.2* 29.6* 30.2*  MCV 76.3* 75.9* 77.7* 77.4*  PLT 473* 421* 340 325    SIGNED: Time coordinating discharge: Over 30 minutes  Debbora PrestoMAGICK-MYERS, ISKRA, MD  [facility] 08/24/2014, 11:16 AM Pager [phone]  If 7PM-7AM, please contact night-coverage [email or web address] Password [email or web address]

## 2014-08-24 NOTE — Care Management Note (Signed)
    Page 1 of 1   08/24/2014     11:47:13 AM CARE MANAGEMENT NOTE 08/24/2014  Patient:  Shari Weeks,Shari Weeks   Account Number:  [id number]  Date Initiated:  08/24/2014  Documentation initiated by:  Lanier ClamMAHABIR,Carly Applegate  Subjective/Objective Assessment:   39 y/o f admitted w/hematemesis.     Action/Plan:   From home.   Anticipated DC Date:  08/24/2014   Anticipated DC Plan:  HOME/SELF CARE      DC Planning Services  CM consult      Choice offered to / List presented to:             Status of service:  Completed, signed off Medicare Important Message given?   (If response is "NO", the following Medicare IM given date fields will be blank) Date Medicare IM given:   Medicare IM given by:   Date Additional Medicare IM given:   Additional Medicare IM given by:    Discharge Disposition:  HOME/SELF CARE  Per UR Regulation:  Reviewed for med. necessity/level of care/duration of stay  If discussed at Long Length of Stay Meetings, dates discussed:    Comments:  08/24/14 Lanier ClamKathy Dewarren Ledbetter RN BSN NCM [phone] D/C home.No d/c needs or orders.

## 2014-08-24 NOTE — Progress Notes (Signed)
D/C instructions reviewed w/ pt by Irving BurtonEmily, RN. Pt states no further questions. Pt d/c in w/c in stable condition by NT to family's car. Pt in possession of d/c instructions, scripts, and all personal belongings.

## 2014-08-24 NOTE — Discharge Instructions (Signed)
Hematemesis °This condition is the vomiting of blood. °CAUSES  °This can happen if you have a peptic ulcer or an irritation of the throat, stomach, or small bowel. Vomiting over and over again or swallowing blood from a nosebleed, coughing or facial injury can also result in bloody vomit. Anti-inflammatory pain medicines are a common cause of this potentially dangerous condition. The most serious causes of vomiting blood include: °· Ulcers (a bacteria called H. pylori is common cause of ulcers). °· Clotting problems. °· Alcoholism. °· Cirrhosis. °TREATMENT  °Treatment depends on the cause and the severity of the bleeding. Small amounts of blood streaks in the vomit is not the same as vomiting large amounts of bloody or dark, coffee grounds-like material. Weakness, fainting, dehydration, anemia, and continued alcohol or drug use increase the risk. Examination may include blood, vomit, or stool tests. The presence of bloody or dark stool that tests positive for blood (Hemoccult) means the bleeding has been going on for some time. Endoscopy and imaging studies may be done. Emergency treatment may include: °· IV medicines or fluids. °· Blood transfusions. °· Surgery. °Hospital care is required for high risk patients or when IV fluids or blood is needed. Upper GI bleeding can cause shock and death if not controlled. °HOME CARE INSTRUCTIONS  °· Your treatment does not require hospital care at this time. °· Remain at rest until your condition improves. °· Drink clear liquids as tolerated. °· Avoid: °¨ Alcohol. °¨ Nicotine. °¨ Aspirin. °¨ Any other anti-inflammatory medicine (ibuprofen, naproxen, and many others). °· Medications to suppress stomach acid or vomiting may be needed. Take all your medicine as prescribed. °· Be sure to see your caregiver for follow-up as recommended. °SEEK IMMEDIATE MEDICAL CARE IF:  °· You have repeated vomiting, dehydration, fainting, or extreme weakness. °· You are vomiting large amounts of  bloody or dark material. °· You pass large, dark or bloody stools. °Document Released: 08/10/2004 Document Revised: 09/25/2011 Document Reviewed: 08/26/2008 °ExitCare® Patient Information ©2015 [employer]. This information is not intended to replace advice given to you by your health care provider. Make sure you discuss any questions you have with your health care provider. ° °

## 2014-08-27 ENCOUNTER — Encounter (HOSPITAL_COMMUNITY): Payer: Self-pay

## 2014-08-27 ENCOUNTER — Ambulatory Visit
Admission: RE | Admit: 2014-08-27 | Discharge: 2014-08-27 | Disposition: A | Discharge status: Home / Self Care | Payer: No Typology Code available for payment source | Source: Ambulatory Visit | Attending: Obstetrics and Gynecology | Admitting: Obstetrics and Gynecology

## 2014-08-27 ENCOUNTER — Ambulatory Visit (HOSPITAL_COMMUNITY)
Admission: RE | Admit: 2014-08-27 | Discharge: 2014-08-27 | Disposition: A | Discharge status: Home / Self Care | Payer: Self-pay | Source: Ambulatory Visit | Attending: Obstetrics and Gynecology | Admitting: Obstetrics and Gynecology

## 2014-08-27 VITALS — BP 138/84 | Temp 98.7°F | Ht 71.0 in | Wt 181.0 lb

## 2014-08-27 DIAGNOSIS — Z1239 Encounter for other screening for malignant neoplasm of breast: Secondary | ICD-10-CM

## 2014-08-27 DIAGNOSIS — N6321 Unspecified lump in the left breast, upper outer quadrant: Secondary | ICD-10-CM

## 2014-08-27 DIAGNOSIS — N6320 Unspecified lump in the left breast, unspecified quadrant: Secondary | ICD-10-CM

## 2014-08-27 NOTE — Progress Notes (Signed)
Complaints of left breast lump x 2-3 weeks.  Pap Smear:  Pap smear not completed today. [facility] [place] and normal per patient. Per patient has no history of an abnormal Pap smear. Last Pap smear result is not in EPIC. Previous Pap smear result on 08/18/2011 is in EPIC.  Physical exam: Breasts Breasts symmetrical. No skin abnormalities bilateral breasts. No nipple retraction bilateral breasts. No nipple discharge bilateral breasts. No lymphadenopathy. No lumps palpated right breast. Palpated a lump within the left breast at 2 o'clock next to the areola. Patient complained of tenderness when palpated right outer breast on exam. [facility] Appointment scheduled for Thursday, August 27, 2014 at 1540.       Pelvic/Bimanual No Pap smear completed today since last Pap smear was in 2014 per patient. Pap smear not indicated per BCCCP guidelines.

## 2014-08-27 NOTE — Patient Instructions (Addendum)
Education materials given on breast awareness. Explained to Shari Weeks that she did not need a Pap smear today due to last Pap smear was in 2014 per patient. Let her know [facility] will cover Pap smears every 3 years unless has a history of abnormal Pap smears. Let patient know that her next Pap smear will be due in 2017. Told patient she can schedule to have completed in [facility] and to call Sabrina. [facility]. Appointment scheduled for Thursday, August 27, 2014 at 1540. Patient aware of appointment and will be there. Shari Weeks verbalized understanding.  Deane Melick, Kathaleen Maserhristine Poll, RN 3:43 PM

## 2014-09-12 ENCOUNTER — Encounter (HOSPITAL_COMMUNITY): Payer: Self-pay | Admitting: *Deleted

## 2014-09-12 ENCOUNTER — Emergency Department (HOSPITAL_COMMUNITY)
Admission: EM | Admit: 2014-09-12 | Discharge: 2014-09-12 | Disposition: A | Discharge status: Home / Self Care | Payer: Self-pay | Attending: Emergency Medicine | Admitting: Emergency Medicine

## 2014-09-12 DIAGNOSIS — F1210 Cannabis abuse, uncomplicated: Secondary | ICD-10-CM | POA: Insufficient documentation

## 2014-09-12 DIAGNOSIS — Z9889 Other specified postprocedural states: Secondary | ICD-10-CM | POA: Insufficient documentation

## 2014-09-12 DIAGNOSIS — I10 Essential (primary) hypertension: Secondary | ICD-10-CM | POA: Insufficient documentation

## 2014-09-12 DIAGNOSIS — Z3202 Encounter for pregnancy test, result negative: Secondary | ICD-10-CM | POA: Insufficient documentation

## 2014-09-12 DIAGNOSIS — Z79899 Other long term (current) drug therapy: Secondary | ICD-10-CM | POA: Insufficient documentation

## 2014-09-12 DIAGNOSIS — R1115 Cyclical vomiting syndrome unrelated to migraine: Secondary | ICD-10-CM

## 2014-09-12 DIAGNOSIS — F1310 Sedative, hypnotic or anxiolytic abuse, uncomplicated: Secondary | ICD-10-CM | POA: Insufficient documentation

## 2014-09-12 DIAGNOSIS — G43A0 Cyclical vomiting, not intractable: Secondary | ICD-10-CM | POA: Insufficient documentation

## 2014-09-12 DIAGNOSIS — Z87891 Personal history of nicotine dependence: Secondary | ICD-10-CM | POA: Insufficient documentation

## 2014-09-12 DIAGNOSIS — N898 Other specified noninflammatory disorders of vagina: Secondary | ICD-10-CM | POA: Insufficient documentation

## 2014-09-12 DIAGNOSIS — K219 Gastro-esophageal reflux disease without esophagitis: Secondary | ICD-10-CM | POA: Insufficient documentation

## 2014-09-12 LAB — COMPREHENSIVE METABOLIC PANEL
ALK PHOS: 77 U/L (ref 39–117)
ALT: 22 U/L (ref 0–35)
AST: 27 U/L (ref 0–37)
Albumin: 4.7 g/dL (ref 3.5–5.2)
Anion gap: 17 — ABNORMAL HIGH (ref 5–15)
BILIRUBIN TOTAL: 1.1 mg/dL (ref 0.3–1.2)
BUN: 14 mg/dL (ref 6–23)
CO2: 22 mmol/L (ref 19–32)
Calcium: 10 mg/dL (ref 8.4–10.5)
Chloride: 102 mmol/L (ref 96–112)
Creatinine, Ser: 0.81 mg/dL (ref 0.50–1.10)
GFR calc non Af Amer: >90 mL/min (ref >90)
Glucose, Bld: 177 mg/dL — ABNORMAL HIGH (ref 70–99)
Potassium: 3.8 mmol/L (ref 3.5–5.1)
SODIUM: 141 mmol/L (ref 135–145)
TOTAL PROTEIN: 8.3 g/dL (ref 6.0–8.3)

## 2014-09-12 LAB — CBC WITH DIFFERENTIAL/PLATELET
Basophils Absolute: 0 10*3/uL (ref 0.0–0.1)
Basophils Relative: 0 % (ref 0–1)
EOS PCT: 0 % (ref 0–5)
Eosinophils Absolute: 0 10*3/uL (ref 0.0–0.7)
HEMATOCRIT: 32.4 % — AB (ref 36.0–46.0)
HEMOGLOBIN: 9.8 g/dL — AB (ref 12.0–15.0)
LYMPHS PCT: 7 % — AB (ref 12–46)
Lymphs Abs: 0.9 10*3/uL (ref 0.7–4.0)
MCH: 22.7 pg — ABNORMAL LOW (ref 26.0–34.0)
MCHC: 30.2 g/dL (ref 30.0–36.0)
MCV: 75.2 fL — ABNORMAL LOW (ref 78.0–100.0)
MONOS PCT: 2 % — AB (ref 3–12)
Monocytes Absolute: 0.3 10*3/uL (ref 0.1–1.0)
NEUTROS PCT: 91 % — AB (ref 43–77)
Neutro Abs: 11.9 10*3/uL — ABNORMAL HIGH (ref 1.7–7.7)
PLATELETS: 499 10*3/uL — AB (ref 150–400)
RBC: 4.31 MIL/uL (ref 3.87–5.11)
RDW: 16.7 % — AB (ref 11.5–15.5)
WBC: 13.1 10*3/uL — ABNORMAL HIGH (ref 4.0–10.5)

## 2014-09-12 LAB — RAPID URINE DRUG SCREEN, HOSP PERFORMED
Amphetamines: NOT DETECTED
BENZODIAZEPINES: POSITIVE — AB
Barbiturates: NOT DETECTED
COCAINE: NOT DETECTED
OPIATES: NOT DETECTED
Tetrahydrocannabinol: POSITIVE — AB

## 2014-09-12 LAB — URINALYSIS, ROUTINE W REFLEX MICROSCOPIC
Bilirubin Urine: NEGATIVE
Glucose, UA: NEGATIVE mg/dL
HGB URINE DIPSTICK: NEGATIVE
KETONES UR: 40 mg/dL — AB
LEUKOCYTES UA: NEGATIVE
Nitrite: NEGATIVE
Protein, ur: NEGATIVE mg/dL
SPECIFIC GRAVITY, URINE: 1.014 (ref 1.005–1.030)
Urobilinogen, UA: 0.2 mg/dL (ref 0.0–1.0)
pH: 8 (ref 5.0–8.0)

## 2014-09-12 LAB — WET PREP, GENITAL
Clue Cells Wet Prep HPF POC: NONE SEEN
TRICH WET PREP: NONE SEEN
Yeast Wet Prep HPF POC: NONE SEEN

## 2014-09-12 LAB — URINE MICROSCOPIC-ADD ON

## 2014-09-12 LAB — I-STAT TROPONIN, ED: TROPONIN I, POC: 0 ng/mL (ref 0.00–0.08)

## 2014-09-12 LAB — PREGNANCY, URINE: Preg Test, Ur: NEGATIVE

## 2014-09-12 LAB — LIPASE, BLOOD: LIPASE: 20 U/L (ref 11–59)

## 2014-09-12 MED ORDER — SODIUM CHLORIDE 0.9 % IV BOLUS (SEPSIS)
1000.0000 mL | Freq: Once | INTRAVENOUS | Status: AC
Start: 2014-09-12 — End: 2014-09-12
  Administered 2014-09-12: 1000 mL via INTRAVENOUS

## 2014-09-12 MED ORDER — METOCLOPRAMIDE HCL 5 MG/ML IJ SOLN
10.0000 mg | Freq: Once | INTRAMUSCULAR | Status: AC
  Administered 2014-09-12: 10 mg via INTRAVENOUS
  Filled 2014-09-12: qty 2

## 2014-09-12 MED ORDER — GI COCKTAIL ~~LOC~~
30.0000 mL | Freq: Once | ORAL | Status: AC
  Administered 2014-09-12: 30 mL via ORAL
  Filled 2014-09-12: qty 30

## 2014-09-12 MED ORDER — PROMETHAZINE HCL 25 MG/ML IJ SOLN
25.0000 mg | Freq: Once | INTRAMUSCULAR | Status: AC
  Administered 2014-09-12: 25 mg via INTRAVENOUS
  Filled 2014-09-12: qty 1

## 2014-09-12 MED ORDER — PROMETHAZINE HCL 25 MG RE SUPP: 25.0000 mg | Freq: Four times a day (QID) | RECTAL | Status: DC | PRN

## 2014-09-12 MED ORDER — PROMETHAZINE HCL 25 MG PO TABS: 25.0000 mg | ORAL_TABLET | Freq: Four times a day (QID) | ORAL | Status: DC | PRN

## 2014-09-12 MED ORDER — FENTANYL CITRATE 0.05 MG/ML IJ SOLN
50.0000 ug | Freq: Once | INTRAMUSCULAR | Status: AC
  Administered 2014-09-12: 50 ug via INTRAVENOUS
  Filled 2014-09-12: qty 2

## 2014-09-12 NOTE — ED Notes (Signed)
[facility] and [facility] Patient given Zofran 4 mg IM by EMS prior to arrival Patient called EMS for c/o vomiting Patient with hx of cyclic vomiting Patient non-compliant with any home medications Patient alert and oriented x 4 upon arrival

## 2014-09-12 NOTE — ED Notes (Signed)
Patient remains hypertensive Dr. Nicanor AlconPalumbo aware of ongoing issue No new orders

## 2014-09-12 NOTE — ED Notes (Signed)
Pt reports nausea is relieved. She is resting comfortably. VSS. Denies pain

## 2014-09-12 NOTE — ED Notes (Signed)
Bed: [id number] Expected date:  Expected time:  Means of arrival:  Comments: EMS 38yo F N/V/D; abd pain

## 2014-09-12 NOTE — ED Notes (Signed)
EKG given to EDP,Palumbo,MD., for review. 

## 2014-09-12 NOTE — ED Notes (Signed)
ED charge unable to obtain PIV site or labs Awaiting arrival of IV team

## 2014-09-12 NOTE — ED Notes (Signed)
IV team RN at bedside.  

## 2014-09-12 NOTE — ED Notes (Signed)
Multiple attempts to obtain labs and PIV access have been unsuccessful ED Charge now at bedside

## 2014-09-12 NOTE — ED Notes (Addendum)
Pt. On monitor.RN,Emily made aware pt. Blood pressure elevated.

## 2014-09-12 NOTE — ED Notes (Signed)
Patient asking what plan of care will be Patient informed that IV team nurse has been consulted  Patient informed that EDP is aware of patient  Patient asking for medications to be given "by the shot" Will inform EDP of patient's request to have IM medications

## 2014-09-12 NOTE — ED Notes (Signed)
IV team RN able to place PIV and obtain labs Will make EDP aware of above

## 2014-09-12 NOTE — ED Notes (Signed)
Consult for IV team placed.  

## 2014-09-12 NOTE — ED Notes (Signed)
IV team RN on the way to see patient

## 2014-09-12 NOTE — Discharge Instructions (Signed)
Phenergan tabs or suppositories as prescribed as needed. Advance diet as tolerate. Follow up with your doctor.    Cyclic Vomiting Syndrome Cyclic vomiting syndrome is a benign condition in which patients experience bouts or cycles of severe nausea and vomiting that last for hours or even days. The bouts of nausea and vomiting alternate with longer periods of no symptoms and generally good health. Cyclic vomiting syndrome occurs mostly in children, but can affect adults. CAUSES  [facility] has no known cause. Each episode is typically similar to the previous ones. The episodes tend to:   Start at about the same time of day.  Last the same length of time.  Present the same symptoms at the same level of intensity. Cyclic vomiting syndrome can begin at any age in children and adults. Cyclic vomiting syndrome usually starts between the ages of 3 and 7 years. In adults, episodes tend to occur less often than they do in children, but they last longer. Furthermore, the events or situations that trigger episodes in adults cannot always be pinpointed as easily as they can in children. There are 4 phases of cyclic vomiting syndrome: 1. Prodrome. The prodrome phase signals that an episode of nausea and vomiting is about to begin. This phase can last from just a few minutes to several hours. This phase is often marked by belly (abdominal) pain. Sometimes taking medicine early in the prodrome phase can stop an episode in progress. However, sometimes there is no warning. A person may simply wake up in the middle of the night or early morning and begin vomiting. 2. Episode. The episode phase consists of:  Severe vomiting.  Nausea.  Gagging (retching). 3. Recovery. The recovery phase begins when the nausea and vomiting stop. Healthy color, appetite, and energy return. 4. Symptom-free interval. The symptom-free interval phase is the period between episodes when no symptoms are present. TRIGGERS Episodes can be  triggered by an infection or event. Examples of triggers include:  Infections.  Colds, allergies, sinus problems, and the flu.  Eating certain foods such as chocolate or cheese.  Foods with monosodium glutamate (MSG) or preservatives.  Fast foods.  Pre-packaged foods.  Foods with low nutritional value (junk foods).  Overeating.  Eating just before going to bed.  Hot weather.  Dehydration.  Not enough sleep or poor sleep quality.  Physical exhaustion.  Menstruation.  Motion sickness.  Emotional stress (school or home difficulties).  Excitement or stress. SYMPTOMS  The main symptoms of cyclic vomiting syndrome are:  Severe vomiting.  Nausea.  Gagging (retching). Episodes usually begin at night or the first thing in the morning. Episodes may include vomiting or retching up to 5 or 6 times an hour during the worst of the episode. Episodes usually last anywhere from 1 to 4 days. Episodes can last for up to 10 days. Other symptoms include:  Paleness.  Exhaustion.  Listlessness.  Abdominal pain.  Loose stools or diarrhea. Sometimes the nausea and vomiting are so severe that a person appears to be almost unconscious. Sensitivity to light, headache, fever, dizziness, may also accompany an episode. In addition, the vomiting may cause drooling and excessive thirst. Drinking water usually leads to more vomiting, though the water can dilute the acid in the vomit, making the episode a little less painful. Continuous vomiting can lead to dehydration, which means that the body has lost excessive water and salts. DIAGNOSIS  Cyclic vomiting syndrome is hard to diagnose because there are no clear tests to identify it. A  caregiver must diagnose cyclic vomiting syndrome by looking at symptoms and medical history. A caregiver must exclude more common diseases or disorders that can also cause nausea and vomiting. Also, diagnosis takes time because caregivers need to identify a  pattern or cycle to the vomiting. TREATMENT  Cyclic vomiting syndrome cannot be cured. Treatment varies, but people with cyclic vomiting syndrome should get plenty of rest and sleep and take medications that prevent, stop, or lessen the vomiting episodes and other symptoms. People whose episodes are frequent and long-lasting may be treated during the symptom-free intervals in an effort to prevent or ease future episodes. The symptom-free phase is a good time to eliminate anything known to trigger an episode. For example, if episodes are brought on by stress or excitement, this period is the time to find ways to reduce stress and stay calm. If sinus problems or allergies cause episodes, those conditions should be treated. The triggers listed above should be avoided or prevented. Because of the similarities between migraine and cyclic vomiting syndrome, caregivers treat some people with severe cyclic vomiting syndrome with drugs that are also used for migraine headaches. The drugs are designed to:  Prevent episodes.  Reduce their frequency.  Lessen their severity. HOME CARE INSTRUCTIONS Once a vomiting episode begins, treatment is supportive. It helps to stay in bed and sleep in a dark, quiet room. Severe nausea and vomiting may require hospitalization and intravenous (IV) fluids to prevent dehydration. Relaxing medications (sedatives) may help if the nausea continues. Sometimes, during the prodrome phase, it is possible to stop an episode from happening altogether. Only take over-the-counter or prescription medicines for pain, discomfort or fever as directed by your caregiver. Do not give aspirin to children. During the recovery phase, drinking water and replacing lost electrolytes (salts in the blood) are very important. Electrolytes are salts that the body needs to function well and stay healthy. Symptoms during the recovery phase can vary. Some people find that their appetites return to normal  immediately, while others need to begin by drinking clear liquids and then move slowly to solid food. RELATED COMPLICATIONS The severe vomiting that defines cyclic vomiting syndrome is a risk factor for several complications:  Dehydration--Vomiting causes the body to lose water quickly.  Electrolyte imbalance--Vomiting also causes the body to lose the important salts it needs to keep working properly.  Peptic esophagitis--The tube that connects the mouth to the stomach (esophagus) becomes injured from the stomach acid that comes up with the vomit.  Hematemesis--The esophagus becomes irritated and bleeds, so blood mixes with the vomit.  Mallory-Weiss tear--The lower end of the esophagus may tear open or the stomach may bruise from vomiting or retching.  Tooth decay--The acid in the vomit can hurt the teeth by corroding the tooth enamel. SEEK MEDICAL CARE IF: You have questions or problems. Document Released: 09/11/2001 Document Revised: 09/25/2011 Document Reviewed: 10/10/2010 [facility] Patient Information 2015 [place], [place]. This information is not intended to replace advice given to you by your health care provider. Make sure you discuss any questions you have with your health care provider.

## 2014-09-12 NOTE — ED Notes (Signed)
Report given to Tim,RN.

## 2014-09-12 NOTE — ED Notes (Signed)
Patient has disconnected herself from the cardiac monitor and is ambulating around the room Patient informed that she needs to get back in bed and that monitor needs to be connected Patient declined to get back in bed and for monitor to be connected Tatyana, PA made aware of above and that IV team RN was able to place PIV site

## 2014-09-12 NOTE — ED Notes (Signed)
EDP made aware of HTN and issue with obtaining PIV access/labs

## 2014-09-12 NOTE — ED Provider Notes (Signed)
CSN: [id number]     Arrival date & time 09/12/14  0346 History   First MD Initiated Contact with Patient 09/12/14 [phone]     Chief Complaint  Patient presents with  . Abdominal Pain     (Consider location/radiation/quality/duration/timing/severity/associated sxs/prior Treatment) HPI Shari Weeks is a 39 y.o. female with hx of GERD, migraines, HTN, cyclic vomiting syndrome, presents to ED with complaint of nausea, vomiting, abdominal pain. She states symptoms started around 11:30 PM last night. She reports pain is left lower quadrant and epigastric. She reports continues vomiting. Denies any diarrhea. No bowel movement this morning. Denies any urinary symptoms. She does admit to abnormal white vaginal discharge. She did not take any medications prior to coming in. Nothing is making her symptoms better or worse. She denies any fever or chills. No back pain. She states this feels similar to her prior cyclical vomiting episodes.  Past Medical History  Diagnosis Date  . GERD (gastroesophageal reflux disease)   . History of migraine   . Hypertension   . Cyclic vomiting syndrome   . Diverticulitis   . Family history of adverse reaction to anesthesia     Mother has a hard time waking up   Past Surgical History  Procedure Laterality Date  . Ectopic pregnancy surgery    . Esophagogastroduodenoscopy N/A 08/22/2014    Procedure: ESOPHAGOGASTRODUODENOSCOPY (EGD);  Surgeon: Charna Elizabeth, MD;  Location: [facility];  Service: Endoscopy;  Laterality: N/A;   Family History  Problem Relation Age of Onset  . Migraines Mother   . Diabetes Father   . Breast cancer Paternal Aunt   . Cancer Paternal Grandmother     cervical   History  Substance Use Topics  . Smoking status: Former Smoker -- 0.50 packs/day    Types: Cigarettes  . Smokeless tobacco: Not on file  . Alcohol Use: No     Comment: occasional   OB History    Gravida Para Term Preterm AB TAB SAB Ectopic Multiple Living   Review of Systems  Constitutional: Negative for fever and chills.  Respiratory: Negative for cough, chest tightness and shortness of breath.   Cardiovascular: Negative for chest pain, palpitations and leg swelling.  Gastrointestinal: Positive for nausea, vomiting and abdominal pain. Negative for diarrhea.  Genitourinary: Positive for vaginal discharge. Negative for dysuria, flank pain, vaginal bleeding, vaginal pain and pelvic pain.  Musculoskeletal: Negative for myalgias, arthralgias, neck pain and neck stiffness.  Skin: Negative for rash.  Neurological: Negative for dizziness, weakness and headaches.  All other systems reviewed and are negative.     Allergies  Aspirin; Ciprofloxacin; and Ibuprofen  Home Medications   Prior to Admission medications   Medication Sig Start Date End Date Taking? Authorizing Provider  citalopram (CELEXA) 20 MG tablet Take 30 mg by mouth daily.    Yes Historical Provider, MD  HYDROXYZINE PAMOATE PO Take 50 mg by mouth at bedtime as needed. For sleeplessness    Historical Provider, MD  LORazepam (ATIVAN) 1 MG tablet Take 1 tablet (1 mg total) by mouth 3 (three) times daily as needed for anxiety. Patient not taking: Reported on 09/12/2014 08/24/14   Dorothea Ogle, MD  metoprolol tartrate (LOPRESSOR) 25 MG tablet Take 2 tablets (50 mg total) by mouth 2 (two) times daily. 08/24/14   Dorothea Ogle, MD  omeprazole (PRILOSEC) 40 MG capsule Take 40 mg by mouth daily.    Historical  Provider, MD  oxyCODONE (OXY IR/ROXICODONE) 5 MG immediate release tablet Take 1 tablet (5 mg total) by mouth every 4 (four) hours as needed for moderate pain. Patient not taking: Reported on 09/12/2014 08/24/14   Dorothea OgleIskra M Myers, MD  pantoprazole (PROTONIX) 40 MG tablet Take 1 tablet (40 mg total) by mouth daily. 08/24/14   Dorothea OgleIskra M Myers, MD  promethazine (PHENERGAN) 25 MG suppository Place 1 suppository (25 mg total) rectally every 6 (six) hours as needed for nausea or vomiting. Patient not  taking: Reported on 08/27/2014 08/24/14   Dorothea OgleIskra M Myers, MD  promethazine (PHENERGAN) 25 MG tablet Take 1 tablet (25 mg total) by mouth every 6 (six) hours as needed for nausea or vomiting. 08/24/14   Dorothea OgleIskra M Myers, MD   BP 173/103 mmHg  Pulse 57  Temp(Src) 97.8 F (36.6 C) (Oral)  Resp 15  SpO2 100%  LMP 08/02/2014 (Exact Date) Physical Exam  Constitutional: She is oriented to person, place, and time. She appears well-developed and well-nourished. No distress.  HENT:  Head: Normocephalic.  Eyes: Conjunctivae are normal.  Neck: Neck supple.  Cardiovascular: Normal rate, regular rhythm and normal heart sounds.   Pulmonary/Chest: Effort normal and breath sounds normal. No respiratory distress. She has no wheezes. She has no rales.  Abdominal: Soft. Bowel sounds are normal. She exhibits no distension. There is tenderness. There is no rebound.  Diffuse tenderness. No guarding. No rebound tenderness  Genitourinary:  Normal external genitalia. Normal vaginal canal. Small thin white discharge. Cervix is normal, closed. No CMT. No uterine or adnexal tenderness. No masses palpated.    Musculoskeletal: She exhibits no edema.  Neurological: She is alert and oriented to person, place, and time.  Skin: Skin is warm and dry.  Psychiatric: She has a normal mood and affect. Her behavior is normal.  Nursing note and vitals reviewed.   ED Course  Procedures (including critical care time) Labs Review Labs Reviewed  WET PREP, GENITAL - Abnormal; Notable for the following:    WBC, Wet Prep HPF POC FEW (*)    All other components within normal limits  CBC WITH DIFFERENTIAL/PLATELET - Abnormal; Notable for the following:    WBC 13.1 (*)    Hemoglobin 9.8 (*)    HCT 32.4 (*)    MCV 75.2 (*)    MCH 22.7 (*)    RDW 16.7 (*)    Platelets 499 (*)    Neutrophils Relative % 91 (*)    Neutro Abs 11.9 (*)    Lymphocytes Relative 7 (*)    Monocytes Relative 2 (*)    All other components within normal  limits  COMPREHENSIVE METABOLIC PANEL - Abnormal; Notable for the following:    Glucose, Bld 177 (*)    Anion gap 17 (*)    All other components within normal limits  URINALYSIS, ROUTINE W REFLEX MICROSCOPIC - Abnormal; Notable for the following:    APPearance TURBID (*)    Ketones, ur 40 (*)    All other components within normal limits  URINE MICROSCOPIC-ADD ON - Abnormal; Notable for the following:    Bacteria, UA MANY (*)    All other components within normal limits  URINE RAPID DRUG SCREEN (HOSP PERFORMED) - Abnormal; Notable for the following:    Benzodiazepines POSITIVE (*)    Tetrahydrocannabinol POSITIVE (*)    All other components within normal limits  LIPASE, BLOOD  PREGNANCY, URINE  I-STAT TROPOININ, ED  GC/CHLAMYDIA PROBE AMP ([place])    Imaging  Review No results found.   EKG Interpretation None      MDM   Final diagnoses:  Non-intractable cyclical vomiting with nausea    Patient with history of cyclical vomiting, here with nausea, vomiting, abdominal pain. Abdomen is diffusely tender, however no guarding or peritoneal signs. She is actively vomiting. Will try Reglan, IV fluids, fentanyl for pain. Patient does admit to abnormal vaginal discharge, will have to do a pelvic exam. Lab work and urinalysis pending. Patient is found to be hypertensive, she states she has not had her blood pressure medications in one month.  10:41 AM Pt feeling much better after reglan, fentanyl, phenergan. No vomiting. Pelvic exam unremarkable. Wet prep pending. Labs reassuring.   Plan to d/c home with phenergan. Follow up with pcp. Abdomen benign. Pt tolerating POs. Agrees with d/c plan. Return precautions discussed.   Filed Vitals:   09/12/14 0600 09/12/14 0732 09/12/14 0817 09/12/14 1059  BP: 173/103 173/91 130/100 111/71  Pulse: 57 62 81 62  Temp:  98 F (36.7 C)    TempSrc:  Oral    Resp:  SpO2: 100% 100% 100% 100%     Lottie Mussel,  PA-C 09/12/14 1605  Doug Sou, MD 09/12/14 1720

## 2014-09-14 LAB — GC/CHLAMYDIA PROBE AMP (~~LOC~~) NOT AT ARMC
Chlamydia: NEGATIVE
Neisseria Gonorrhea: NEGATIVE

## 2015-03-30 ENCOUNTER — Encounter (HOSPITAL_BASED_OUTPATIENT_CLINIC_OR_DEPARTMENT_OTHER): Payer: Self-pay | Admitting: *Deleted

## 2015-03-30 ENCOUNTER — Emergency Department (HOSPITAL_BASED_OUTPATIENT_CLINIC_OR_DEPARTMENT_OTHER)
Admission: EM | Admit: 2015-03-30 | Discharge: 2015-03-30 | Disposition: A | Discharge status: Home / Self Care | Payer: Self-pay | Attending: Emergency Medicine | Admitting: Emergency Medicine

## 2015-03-30 DIAGNOSIS — Z87891 Personal history of nicotine dependence: Secondary | ICD-10-CM | POA: Insufficient documentation

## 2015-03-30 DIAGNOSIS — G43A0 Cyclical vomiting, not intractable: Secondary | ICD-10-CM | POA: Insufficient documentation

## 2015-03-30 DIAGNOSIS — Z79899 Other long term (current) drug therapy: Secondary | ICD-10-CM | POA: Insufficient documentation

## 2015-03-30 DIAGNOSIS — R1115 Cyclical vomiting syndrome unrelated to migraine: Secondary | ICD-10-CM

## 2015-03-30 DIAGNOSIS — R1084 Generalized abdominal pain: Secondary | ICD-10-CM | POA: Insufficient documentation

## 2015-03-30 DIAGNOSIS — R5383 Other fatigue: Secondary | ICD-10-CM | POA: Insufficient documentation

## 2015-03-30 DIAGNOSIS — K219 Gastro-esophageal reflux disease without esophagitis: Secondary | ICD-10-CM | POA: Insufficient documentation

## 2015-03-30 DIAGNOSIS — I10 Essential (primary) hypertension: Secondary | ICD-10-CM | POA: Insufficient documentation

## 2015-03-30 DIAGNOSIS — R630 Anorexia: Secondary | ICD-10-CM | POA: Insufficient documentation

## 2015-03-30 LAB — COMPREHENSIVE METABOLIC PANEL
ALT: 17 U/L (ref 14–54)
ANION GAP: 12 (ref 5–15)
AST: 20 U/L (ref 15–41)
Albumin: 4.7 g/dL (ref 3.5–5.0)
Alkaline Phosphatase: 64 U/L (ref 38–126)
BILIRUBIN TOTAL: 1.4 mg/dL — AB (ref 0.3–1.2)
BUN: 18 mg/dL (ref 6–20)
CHLORIDE: 101 mmol/L (ref 101–111)
CO2: 24 mmol/L (ref 22–32)
Calcium: 9.6 mg/dL (ref 8.9–10.3)
Creatinine, Ser: 0.84 mg/dL (ref 0.44–1.00)
GFR calc Af Amer: >60 mL/min (ref >60)
GFR calc non Af Amer: >60 mL/min (ref >60)
GLUCOSE: 135 mg/dL — AB (ref 65–99)
POTASSIUM: 3.2 mmol/L — AB (ref 3.5–5.1)
Sodium: 137 mmol/L (ref 135–145)
TOTAL PROTEIN: 8.7 g/dL — AB (ref 6.5–8.1)

## 2015-03-30 LAB — CBC WITH DIFFERENTIAL/PLATELET
BASOS PCT: 0 % (ref 0–1)
Basophils Absolute: 0 10*3/uL (ref 0.0–0.1)
Eosinophils Absolute: 0 10*3/uL (ref 0.0–0.7)
Eosinophils Relative: 0 % (ref 0–5)
HEMATOCRIT: 33.9 % — AB (ref 36.0–46.0)
Hemoglobin: 10.1 g/dL — ABNORMAL LOW (ref 12.0–15.0)
LYMPHS PCT: 11 % — AB (ref 12–46)
Lymphs Abs: 1.6 10*3/uL (ref 0.7–4.0)
MCH: 22.1 pg — ABNORMAL LOW (ref 26.0–34.0)
MCHC: 29.8 g/dL — AB (ref 30.0–36.0)
MCV: 74.3 fL — ABNORMAL LOW (ref 78.0–100.0)
MONO ABS: 1 10*3/uL (ref 0.1–1.0)
Monocytes Relative: 7 % (ref 3–12)
Neutro Abs: 12.2 10*3/uL — ABNORMAL HIGH (ref 1.7–7.7)
Neutrophils Relative %: 82 % — ABNORMAL HIGH (ref 43–77)
Platelets: 524 10*3/uL — ABNORMAL HIGH (ref 150–400)
RBC: 4.56 MIL/uL (ref 3.87–5.11)
RDW: 17 % — AB (ref 11.5–15.5)
WBC: 14.8 10*3/uL — ABNORMAL HIGH (ref 4.0–10.5)

## 2015-03-30 LAB — URINE MICROSCOPIC-ADD ON

## 2015-03-30 LAB — URINALYSIS, ROUTINE W REFLEX MICROSCOPIC
Bilirubin Urine: NEGATIVE
Glucose, UA: NEGATIVE mg/dL
KETONES UR: 15 mg/dL — AB
LEUKOCYTES UA: NEGATIVE
NITRITE: NEGATIVE
Protein, ur: 100 mg/dL — AB
SPECIFIC GRAVITY, URINE: 1.03 (ref 1.005–1.030)
UROBILINOGEN UA: 0.2 mg/dL (ref 0.0–1.0)
pH: 6 (ref 5.0–8.0)

## 2015-03-30 LAB — LIPASE, BLOOD: LIPASE: 19 U/L — AB (ref 22–51)

## 2015-03-30 MED ORDER — SODIUM CHLORIDE 0.9 % IV BOLUS (SEPSIS)
1000.0000 mL | Freq: Once | INTRAVENOUS | Status: AC
  Administered 2015-03-30: 1000 mL via INTRAVENOUS

## 2015-03-30 MED ORDER — ONDANSETRON HCL 4 MG/2ML IJ SOLN
4.0000 mg | Freq: Once | INTRAMUSCULAR | Status: AC
  Administered 2015-03-30: 4 mg via INTRAVENOUS
  Filled 2015-03-30: qty 2

## 2015-03-30 MED ORDER — DICYCLOMINE HCL 10 MG/ML IM SOLN
20.0000 mg | Freq: Once | INTRAMUSCULAR | Status: AC
  Administered 2015-03-30: 20 mg via INTRAMUSCULAR
  Filled 2015-03-30: qty 2

## 2015-03-30 MED ORDER — PROMETHAZINE HCL 25 MG PO TABS: 25.0000 mg | ORAL_TABLET | Freq: Four times a day (QID) | ORAL | Status: DC | PRN

## 2015-03-30 MED ORDER — PROMETHAZINE HCL 25 MG/ML IJ SOLN
12.5000 mg | Freq: Once | INTRAMUSCULAR | Status: AC
  Administered 2015-03-30: 12.5 mg via INTRAVENOUS
  Filled 2015-03-30: qty 1

## 2015-03-30 NOTE — Discharge Instructions (Signed)
Cyclic Vomiting Syndrome °Cyclic vomiting syndrome is a benign condition in which patients experience bouts or cycles of severe nausea and vomiting that last for hours or even days. The bouts of nausea and vomiting alternate with longer periods of no symptoms and generally good health. Cyclic vomiting syndrome occurs mostly in children, but can affect adults. °CAUSES  °CVS has no known cause. Each episode is typically similar to the previous ones. The episodes tend to:  °· Start at about the same time of day. °· Last the same length of time. °· Present the same symptoms at the same level of intensity. °Cyclic vomiting syndrome can begin at any age in children and adults. Cyclic vomiting syndrome usually starts between the ages of 3 and 7 years. In adults, episodes tend to occur less often than they do in children, but they last longer. Furthermore, the events or situations that trigger episodes in adults cannot always be pinpointed as easily as they can in children. °There are 4 phases of cyclic vomiting syndrome: °1. Prodrome. The prodrome phase signals that an episode of nausea and vomiting is about to begin. This phase can last from just a few minutes to several hours. This phase is often marked by belly (abdominal) pain. Sometimes taking medicine early in the prodrome phase can stop an episode in progress. However, sometimes there is no warning. A person may simply wake up in the middle of the night or early morning and begin vomiting. °2. Episode. The episode phase consists of: °· Severe vomiting. °· Nausea. °· Gagging (retching). °3. Recovery. The recovery phase begins when the nausea and vomiting stop. Healthy color, appetite, and energy return. °4. Symptom-free interval. The symptom-free interval phase is the period between episodes when no symptoms are present. °TRIGGERS °Episodes can be triggered by an infection or event. Examples of triggers include: °· Infections. °· Colds, allergies, sinus problems, and  the flu. °· Eating certain foods such as chocolate or cheese. °· Foods with monosodium glutamate (MSG) or preservatives. °· Fast foods. °· Pre-packaged foods. °· Foods with low nutritional value (junk foods). °· Overeating. °· Eating just before going to bed. °· Hot weather. °· Dehydration. °· Not enough sleep or poor sleep quality. °· Physical exhaustion. °· Menstruation. °· Motion sickness. °· Emotional stress (school or home difficulties). °· Excitement or stress. °SYMPTOMS  °The main symptoms of cyclic vomiting syndrome are: °· Severe vomiting. °· Nausea. °· Gagging (retching). °Episodes usually begin at night or the first thing in the morning. Episodes may include vomiting or retching up to 5 or 6 times an hour during the worst of the episode. Episodes usually last anywhere from 1 to 4 days. Episodes can last for up to 10 days. Other symptoms include: °· Paleness. °· Exhaustion. °· Listlessness. °· Abdominal pain. °· Loose stools or diarrhea. °Sometimes the nausea and vomiting are so severe that a person appears to be almost unconscious. Sensitivity to light, headache, fever, dizziness, may also accompany an episode. In addition, the vomiting may cause drooling and excessive thirst. Drinking water usually leads to more vomiting, though the water can dilute the acid in the vomit, making the episode a little less painful. Continuous vomiting can lead to dehydration, which means that the body has lost excessive water and salts. °DIAGNOSIS  °Cyclic vomiting syndrome is hard to diagnose because there are no clear tests to identify it. A caregiver must diagnose cyclic vomiting syndrome by looking at symptoms and medical history. A caregiver must exclude more common diseases   or disorders that can also cause nausea and vomiting. Also, diagnosis takes time because caregivers need to identify a pattern or cycle to the vomiting. °TREATMENT  °Cyclic vomiting syndrome cannot be cured. Treatment varies, but people with  cyclic vomiting syndrome should get plenty of rest and sleep and take medications that prevent, stop, or lessen the vomiting episodes and other symptoms. °People whose episodes are frequent and long-lasting may be treated during the symptom-free intervals in an effort to prevent or ease future episodes. The symptom-free phase is a good time to eliminate anything known to trigger an episode. For example, if episodes are brought on by stress or excitement, this period is the time to find ways to reduce stress and stay calm. If sinus problems or allergies cause episodes, those conditions should be treated. The triggers listed above should be avoided or prevented. °Because of the similarities between migraine and cyclic vomiting syndrome, caregivers treat some people with severe cyclic vomiting syndrome with drugs that are also used for migraine headaches. The drugs are designed to: °· Prevent episodes. °· Reduce their frequency. °· Lessen their severity. °HOME CARE INSTRUCTIONS °Once a vomiting episode begins, treatment is supportive. It helps to stay in bed and sleep in a dark, quiet room. Severe nausea and vomiting may require hospitalization and intravenous (IV) fluids to prevent dehydration. Relaxing medications (sedatives) may help if the nausea continues. Sometimes, during the prodrome phase, it is possible to stop an episode from happening altogether. Only take over-the-counter or prescription medicines for pain, discomfort or fever as directed by your caregiver. Do not give aspirin to children. °During the recovery phase, drinking water and replacing lost electrolytes (salts in the blood) are very important. Electrolytes are salts that the body needs to function well and stay healthy. Symptoms during the recovery phase can vary. Some people find that their appetites return to normal immediately, while others need to begin by drinking clear liquids and then move slowly to solid food. °RELATED COMPLICATIONS °The  severe vomiting that defines cyclic vomiting syndrome is a risk factor for several complications: °· Dehydration--Vomiting causes the body to lose water quickly. °· Electrolyte imbalance--Vomiting also causes the body to lose the important salts it needs to keep working properly. °· Peptic esophagitis--The tube that connects the mouth to the stomach (esophagus) becomes injured from the stomach acid that comes up with the vomit. °· Hematemesis--The esophagus becomes irritated and bleeds, so blood mixes with the vomit. °· Mallory-Weiss tear--The lower end of the esophagus may tear open or the stomach may bruise from vomiting or retching. °· Tooth decay--The acid in the vomit can hurt the teeth by corroding the tooth enamel. °SEEK MEDICAL CARE IF: °You have questions or problems. °Document Released: 09/11/2001 Document Revised: 09/25/2011 Document Reviewed: 10/10/2010 °[employer]® Patient Information ©2015 [employer]. This information is not intended to replace advice given to you by your health care provider. Make sure you discuss any questions you have with your health care provider. ° °

## 2015-03-30 NOTE — ED Notes (Signed)
MD at bedside. 

## 2015-03-30 NOTE — ED Notes (Signed)
Vomiting since yesterday. Hx cyclic vomiting syndrome. Back pain.

## 2015-03-30 NOTE — ED Notes (Signed)
During assessment, pt interrupted this RNs assessment and questioning to ask, can I just request that the doctor put the IV in my neck. "I know that I have nothing and I don't want to be stuck a million times." Will make MD aware.

## 2015-03-31 NOTE — ED Provider Notes (Signed)
CSN: [id number]     Arrival date & time 03/30/15  1721 History   First MD Initiated Contact with Patient 03/30/15 1814     Chief Complaint  Patient presents with  . Emesis     (Consider location/radiation/quality/duration/timing/severity/associated sxs/prior Treatment) HPI Comments: 39 y.o. Female with history of GERD, HTn, cyclic vomiting presents for acute vomiting.  The patient reports that over the last 36 hours she has been vomiting uncontrollably and that this is just like her episodes of cyclic vomiting in the past.  She says she has not been able to hold down food or liquids since the onset of symptoms.  The patient denies constipation or diarrhea.  The patient says that this is only her second episode of her cyclic vomiting since February.  She did not take anything at home for her symptoms.  She reports burning pain in her upper stomach and cramping pain all over her abdomen.  No known sick contacts.  Patient is a 39 y.o. female presenting with vomiting.  Emesis Associated symptoms: no chills, no diarrhea, no headaches and no myalgias     Past Medical History  Diagnosis Date  . GERD (gastroesophageal reflux disease)   . History of migraine   . Hypertension   . Cyclic vomiting syndrome   . Diverticulitis   . Family history of adverse reaction to anesthesia     Mother has a hard time waking up   Past Surgical History  Procedure Laterality Date  . Ectopic pregnancy surgery    . Esophagogastroduodenoscopy N/A 08/22/2014    Procedure: ESOPHAGOGASTRODUODENOSCOPY (EGD);  Surgeon: Charna Elizabeth, MD;  Location: [facility];  Service: Endoscopy;  Laterality: N/A;   Family History  Problem Relation Age of Onset  . Migraines Mother   . Diabetes Father   . Breast cancer Paternal Aunt   . Cancer Paternal Grandmother     cervical   Social History  Substance Use Topics  . Smoking status: Former Smoker -- 0.50 packs/day    Types: Cigarettes  . Smokeless tobacco: None  . Alcohol  Use: No     Comment: occasional   OB History    Gravida Para Term Preterm AB TAB SAB Ectopic Multiple Living   3    3  1 2        Review of Systems  Constitutional: Positive for activity change and fatigue. Negative for fever, chills and diaphoresis.  HENT: Negative for congestion, ear pain, postnasal drip and rhinorrhea.   Eyes: Negative for pain.  Respiratory: Negative for cough, chest tightness and shortness of breath.   Cardiovascular: Negative for chest pain and palpitations.  Gastrointestinal: Positive for nausea, vomiting and abdominal distention. Negative for diarrhea and constipation.  Genitourinary: Negative for dysuria, urgency and hematuria.  Musculoskeletal: Negative for myalgias and back pain.  Skin: Negative for rash.  Neurological: Negative for dizziness, weakness, light-headedness and headaches.  Hematological: Does not bruise/bleed easily.      Allergies  Aspirin; Ciprofloxacin; and Ibuprofen  Home Medications   Prior to Admission medications   Medication Sig Start Date End Date Taking? Authorizing Provider  citalopram (CELEXA) 20 MG tablet Take 30 mg by mouth daily.     Historical Provider, MD  HYDROXYZINE PAMOATE PO Take 50 mg by mouth at bedtime as needed. For sleeplessness    Historical Provider, MD  LORazepam (ATIVAN) 1 MG tablet Take 1 tablet (1 mg total) by mouth 3 (three) times daily as needed for anxiety. Patient not taking: Reported on 09/12/2014 08/24/14  Dorothea Ogle, MD  metoprolol tartrate (LOPRESSOR) 25 MG tablet Take 2 tablets (50 mg total) by mouth 2 (two) times daily. 08/24/14   Dorothea Ogle, MD  omeprazole (PRILOSEC) 40 MG capsule Take 40 mg by mouth daily.    Historical Provider, MD  oxyCODONE (OXY IR/ROXICODONE) 5 MG immediate release tablet Take 1 tablet (5 mg total) by mouth every 4 (four) hours as needed for moderate pain. Patient not taking: Reported on 09/12/2014 08/24/14   Dorothea Ogle, MD  pantoprazole (PROTONIX) 40 MG tablet Take 1  tablet (40 mg total) by mouth daily. 08/24/14   Dorothea Ogle, MD  promethazine (PHENERGAN) 25 MG tablet Take 1 tablet (25 mg total) by mouth every 6 (six) hours as needed for nausea or vomiting. 03/30/15   Leta Baptist, MD   BP 120/65 mmHg  Pulse 80  Temp(Src) 98.8 F (37.1 C) (Oral)  Resp 16  Ht  (1.803 m)  Wt 180 lb (81.647 kg)  BMI 25.12 kg/m2  SpO2 100%  LMP 03/23/2015 Physical Exam  Constitutional: She is oriented to person, place, and time. She appears well-developed and well-nourished. No distress.  HENT:  Head: Normocephalic and atraumatic.  Right Ear: External ear normal.  Left Ear: External ear normal.  Mouth/Throat: Oropharynx is clear and moist.  Eyes: EOM are normal. Pupils are equal, round, and reactive to light.  Neck: Normal range of motion. Neck supple.  Cardiovascular: Normal rate, regular rhythm, normal heart sounds and intact distal pulses.   No murmur heard. Pulmonary/Chest: Effort normal. No respiratory distress. She has no wheezes. She has no rales.  Abdominal: Soft. She exhibits no distension. There is tenderness (mild, generalized without focal area of tenderness or pain). There is no rebound and no guarding.  Musculoskeletal: She exhibits no edema or tenderness.  Neurological: She is alert and oriented to person, place, and time.  Skin: Skin is warm and dry. No rash noted. She is not diaphoretic.  Vitals reviewed.   ED Course  Procedures (including critical care time) Labs Review Labs Reviewed  CBC WITH DIFFERENTIAL/PLATELET - Abnormal; Notable for the following:    WBC 14.8 (*)    Hemoglobin 10.1 (*)    HCT 33.9 (*)    MCV 74.3 (*)    MCH 22.1 (*)    MCHC 29.8 (*)    RDW 17.0 (*)    Platelets 524 (*)    Neutrophils Relative % 82 (*)    Lymphocytes Relative 11 (*)    Neutro Abs 12.2 (*)    All other components within normal limits  COMPREHENSIVE METABOLIC PANEL - Abnormal; Notable for the following:    Potassium 3.2 (*)    Glucose,  Bld 135 (*)    Total Protein 8.7 (*)    Total Bilirubin 1.4 (*)    All other components within normal limits  LIPASE, BLOOD - Abnormal; Notable for the following:    Lipase 19 (*)    All other components within normal limits  URINALYSIS, ROUTINE W REFLEX MICROSCOPIC (NOT AT [facility]) - Abnormal; Notable for the following:    APPearance CLOUDY (*)    Hgb urine dipstick TRACE (*)    Ketones, ur 15 (*)    Protein, ur 100 (*)    All other components within normal limits  URINE MICROSCOPIC-ADD ON - Abnormal; Notable for the following:    Squamous Epithelial / LPF FEW (*)    All other components within normal limits    Imaging  Review No results found. I have personally reviewed and evaluated these images and lab results as part of my medical decision-making.   EKG Interpretation None      MDM  Patient was seen and evaluated in stable condition.  Examination and presentation consistent with flare of cyclic vomiting.  Patient was difficult IV start and eventually IV placed in arm.  Patient was given IV fluids, zofran, phenergan, bentyl with improvement in her symptoms.  Laboratory results with leukocytosis and thrombocytosis consistent with reaction to inflammation and vomiting.  On reevaluation patient felt improved and requested discharge home.  Patient was discharged home in stable condition with a prescription for Phenergan.  She was instructed to follow up with her PCP and given strict return precautions. Final diagnoses:  Non-intractable cyclical vomiting with nausea    1. Cyclic vomiting    Leta Baptist, MD 03/31/15 [phone]

## 2015-06-16 ENCOUNTER — Emergency Department (HOSPITAL_BASED_OUTPATIENT_CLINIC_OR_DEPARTMENT_OTHER): Payer: Self-pay

## 2015-06-16 ENCOUNTER — Encounter (HOSPITAL_BASED_OUTPATIENT_CLINIC_OR_DEPARTMENT_OTHER): Payer: Self-pay

## 2015-06-16 ENCOUNTER — Emergency Department (HOSPITAL_BASED_OUTPATIENT_CLINIC_OR_DEPARTMENT_OTHER)
Admission: EM | Admit: 2015-06-16 | Discharge: 2015-06-16 | Disposition: A | Discharge status: Home / Self Care | Payer: Self-pay | Attending: Emergency Medicine | Admitting: Emergency Medicine

## 2015-06-16 DIAGNOSIS — R109 Unspecified abdominal pain: Secondary | ICD-10-CM

## 2015-06-16 DIAGNOSIS — Z3202 Encounter for pregnancy test, result negative: Secondary | ICD-10-CM | POA: Insufficient documentation

## 2015-06-16 DIAGNOSIS — K219 Gastro-esophageal reflux disease without esophagitis: Secondary | ICD-10-CM | POA: Insufficient documentation

## 2015-06-16 DIAGNOSIS — Z79899 Other long term (current) drug therapy: Secondary | ICD-10-CM | POA: Insufficient documentation

## 2015-06-16 DIAGNOSIS — I10 Essential (primary) hypertension: Secondary | ICD-10-CM | POA: Insufficient documentation

## 2015-06-16 DIAGNOSIS — N1330 Unspecified hydronephrosis: Secondary | ICD-10-CM

## 2015-06-16 DIAGNOSIS — Z87891 Personal history of nicotine dependence: Secondary | ICD-10-CM | POA: Insufficient documentation

## 2015-06-16 DIAGNOSIS — N201 Calculus of ureter: Secondary | ICD-10-CM

## 2015-06-16 DIAGNOSIS — N130 Hydronephrosis with ureteropelvic junction obstruction: Secondary | ICD-10-CM | POA: Insufficient documentation

## 2015-06-16 DIAGNOSIS — Z8669 Personal history of other diseases of the nervous system and sense organs: Secondary | ICD-10-CM | POA: Insufficient documentation

## 2015-06-16 LAB — CBC WITH DIFFERENTIAL/PLATELET
Basophils Absolute: 0 10*3/uL (ref 0.0–0.1)
Basophils Relative: 0 %
EOS PCT: 0 %
Eosinophils Absolute: 0 10*3/uL (ref 0.0–0.7)
HCT: 30.1 % — ABNORMAL LOW (ref 36.0–46.0)
HEMOGLOBIN: 8.9 g/dL — AB (ref 12.0–15.0)
Lymphocytes Relative: 20 %
Lymphs Abs: 2.9 10*3/uL (ref 0.7–4.0)
MCH: 21.8 pg — AB (ref 26.0–34.0)
MCHC: 29.6 g/dL — ABNORMAL LOW (ref 30.0–36.0)
MCV: 73.6 fL — AB (ref 78.0–100.0)
Monocytes Absolute: 1.1 10*3/uL — ABNORMAL HIGH (ref 0.1–1.0)
Monocytes Relative: 8 %
NEUTROS ABS: 10.3 10*3/uL — AB (ref 1.7–7.7)
Neutrophils Relative %: 72 %
Platelets: 469 10*3/uL — ABNORMAL HIGH (ref 150–400)
RBC: 4.09 MIL/uL (ref 3.87–5.11)
RDW: 17.4 % — ABNORMAL HIGH (ref 11.5–15.5)
WBC: 14.3 10*3/uL — ABNORMAL HIGH (ref 4.0–10.5)

## 2015-06-16 LAB — URINE MICROSCOPIC-ADD ON

## 2015-06-16 LAB — URINALYSIS, ROUTINE W REFLEX MICROSCOPIC
Bilirubin Urine: NEGATIVE
GLUCOSE, UA: NEGATIVE mg/dL
Ketones, ur: 15 mg/dL — AB
Nitrite: NEGATIVE
PH: 7 (ref 5.0–8.0)
Protein, ur: NEGATIVE mg/dL
Specific Gravity, Urine: 1.018 (ref 1.005–1.030)

## 2015-06-16 LAB — BASIC METABOLIC PANEL
ANION GAP: 8 (ref 5–15)
BUN: 15 mg/dL (ref 6–20)
CO2: 26 mmol/L (ref 22–32)
Calcium: 9.1 mg/dL (ref 8.9–10.3)
Chloride: 103 mmol/L (ref 101–111)
Creatinine, Ser: 1.11 mg/dL — ABNORMAL HIGH (ref 0.44–1.00)
GFR calc Af Amer: >60 mL/min (ref >60)
GFR calc non Af Amer: >60 mL/min (ref >60)
GLUCOSE: 144 mg/dL — AB (ref 65–99)
POTASSIUM: 3.3 mmol/L — AB (ref 3.5–5.1)
Sodium: 137 mmol/L (ref 135–145)

## 2015-06-16 LAB — PREGNANCY, URINE: Preg Test, Ur: NEGATIVE

## 2015-06-16 MED ORDER — ONDANSETRON HCL 4 MG/2ML IJ SOLN
4.0000 mg | Freq: Once | INTRAMUSCULAR | Status: AC
  Administered 2015-06-16: 4 mg via INTRAVENOUS
  Filled 2015-06-16: qty 2

## 2015-06-16 MED ORDER — HYDROMORPHONE HCL 1 MG/ML IJ SOLN
1.0000 mg | Freq: Once | INTRAMUSCULAR | Status: AC
  Administered 2015-06-16: 1 mg via INTRAVENOUS
  Filled 2015-06-16: qty 1

## 2015-06-16 MED ORDER — KETOROLAC TROMETHAMINE 30 MG/ML IJ SOLN
30.0000 mg | Freq: Once | INTRAMUSCULAR | Status: AC
  Administered 2015-06-16: 30 mg via INTRAVENOUS
  Filled 2015-06-16: qty 1

## 2015-06-16 MED ORDER — OXYCODONE-ACETAMINOPHEN 5-325 MG PO TABS: 1.0000 | ORAL_TABLET | ORAL | Status: DC | PRN

## 2015-06-16 MED ORDER — PROMETHAZINE HCL 25 MG PO TABS: 25.0000 mg | ORAL_TABLET | Freq: Four times a day (QID) | ORAL | Status: DC | PRN

## 2015-06-16 MED ORDER — OXYCODONE-ACETAMINOPHEN 5-325 MG PO TABS
1.0000 | ORAL_TABLET | Freq: Once | ORAL | Status: AC
  Administered 2015-06-16: 1 via ORAL
  Filled 2015-06-16: qty 1

## 2015-06-16 MED ORDER — ONDANSETRON 4 MG PO TBDP
4.0000 mg | ORAL_TABLET | Freq: Three times a day (TID) | ORAL | Status: DC | PRN
Start: 2015-06-16 — End: 2016-10-04

## 2015-06-16 MED ORDER — TAMSULOSIN HCL 0.4 MG PO CAPS: 0.4000 mg | ORAL_CAPSULE | Freq: Every day | ORAL | Status: DC

## 2015-06-16 MED ORDER — ONDANSETRON HCL 4 MG/2ML IJ SOLN
INTRAMUSCULAR | Status: AC
  Filled 2015-06-16: qty 2

## 2015-06-16 MED ORDER — PROMETHAZINE HCL 25 MG/ML IJ SOLN
12.5000 mg | Freq: Once | INTRAMUSCULAR | Status: AC
  Administered 2015-06-16: 12.5 mg via INTRAVENOUS
  Filled 2015-06-16: qty 1

## 2015-06-16 MED ORDER — ONDANSETRON HCL 4 MG/2ML IJ SOLN
4.0000 mg | Freq: Once | INTRAMUSCULAR | Status: AC
  Administered 2015-06-16: 4 mg via INTRAVENOUS

## 2015-06-16 NOTE — ED Notes (Signed)
Called to room by patient- pt alert, crying, restless on stretcher. C/o right flank pain states "I've never had pain like this". Assessed pt for PIV access. Pt states she is a difficult stick and had last IV placed by ultrasound and before that "in my neck".

## 2015-06-16 NOTE — ED Notes (Signed)
Pt has a urine cup at bedside but unable to provide sample at this time

## 2015-06-16 NOTE — Discharge Instructions (Signed)
Kidney Stones °Kidney stones (urolithiasis) are deposits that form inside your kidneys. The intense pain is caused by the stone moving through the urinary tract. When the stone moves, the ureter goes into spasm around the stone. The stone is usually passed in the urine.  °CAUSES  °· A disorder that makes certain neck glands produce too much parathyroid hormone (primary hyperparathyroidism). °· A buildup of uric acid crystals, similar to gout in your joints. °· Narrowing (stricture) of the ureter. °· A kidney obstruction present at birth (congenital obstruction). °· Previous surgery on the kidney or ureters. °· Numerous kidney infections. °SYMPTOMS  °· Feeling sick to your stomach (nauseous). °· Throwing up (vomiting). °· Blood in the urine (hematuria). °· Pain that usually spreads (radiates) to the groin. °· Frequency or urgency of urination. °DIAGNOSIS  °· Taking a history and physical exam. °· Blood or urine tests. °· CT scan. °· Occasionally, an examination of the inside of the urinary bladder (cystoscopy) is performed. °TREATMENT  °· Observation. °· Increasing your fluid intake. °· Extracorporeal shock wave lithotripsy--This is a noninvasive procedure that uses shock waves to break up kidney stones. °· Surgery may be needed if you have severe pain or persistent obstruction. There are various surgical procedures. Most of the procedures are performed with the use of small instruments. Only small incisions are needed to accommodate these instruments, so recovery time is minimized. °The size, location, and chemical composition are all important variables that will determine the proper choice of action for you. Talk to your health care provider to better understand your situation so that you will minimize the risk of injury to yourself and your kidney.  °HOME CARE INSTRUCTIONS  °· Drink enough water and fluids to keep your urine clear or pale yellow. This will help you to pass the stone or stone fragments. °· Strain  all urine through the provided strainer. Keep all particulate matter and stones for your health care provider to see. The stone causing the pain may be as small as a grain of salt. It is very important to use the strainer each and every time you pass your urine. The collection of your stone will allow your health care provider to analyze it and verify that a stone has actually passed. The stone analysis will often identify what you can do to reduce the incidence of recurrences. °· Only take over-the-counter or prescription medicines for pain, discomfort, or fever as directed by your health care provider. °· Keep all follow-up visits as told by your health care provider. This is important. °· Get follow-up X-rays if required. The absence of pain does not always mean that the stone has passed. It may have only stopped moving. If the urine remains completely obstructed, it can cause loss of kidney function or even complete destruction of the kidney. It is your responsibility to make sure X-rays and follow-ups are completed. Ultrasounds of the kidney can show blockages and the status of the kidney. Ultrasounds are not associated with any radiation and can be performed easily in a matter of minutes. °· Make changes to your daily diet as told by your health care provider. You may be told to: °¨ Limit the amount of salt that you eat. °¨ Eat 5 or more servings of fruits and vegetables each day. °¨ Limit the amount of meat, poultry, fish, and eggs that you eat. °· Collect a 24-hour urine sample as told by your health care provider. You may need to collect another urine sample every 6-12   months. °SEEK MEDICAL CARE IF: °· You experience pain that is progressive and unresponsive to any pain medicine you have been prescribed. °SEEK IMMEDIATE MEDICAL CARE IF:  °· Pain cannot be controlled with the prescribed medicine. °· You have a fever or shaking chills. °· The severity or intensity of pain increases over 18 hours and is not  relieved by pain medicine. °· You develop a new onset of abdominal pain. °· You feel faint or pass out. °· You are unable to urinate. °  °This information is not intended to replace advice given to you by your health care provider. Make sure you discuss any questions you have with your health care provider. °  °Document Released: 07/03/2005 Document Revised: 03/24/2015 Document Reviewed: 12/04/2012 °Elsevier Interactive Patient Education ©2016 [employer] ° °

## 2015-06-16 NOTE — ED Notes (Signed)
Right flank pain x 6 days-vomiting in triage

## 2015-06-16 NOTE — ED Notes (Signed)
Taynr, RN at bedside with ultrasound to attempt piv placement

## 2015-06-16 NOTE — ED Provider Notes (Signed)
CSN: [id number]     Arrival date & time 06/16/15  1103 History   First MD Initiated Contact with Patient 06/16/15 1200     Chief Complaint  Patient presents with  . Flank Pain     (Consider location/radiation/quality/duration/timing/severity/associated sxs/prior Treatment) Patient is a 39 y.o. female presenting with flank pain. The history is provided by the patient.  Flank Pain This is a new problem. Episode onset: 3 hours ago. The problem occurs constantly. The problem has been resolved. Pertinent negatives include no chest pain and no abdominal pain. Nothing aggravates the symptoms. Nothing relieves the symptoms. She has tried nothing for the symptoms. The treatment provided no relief.    Past Medical History  Diagnosis Date  . GERD (gastroesophageal reflux disease)   . History of migraine   . Hypertension   . Cyclic vomiting syndrome   . Diverticulitis   . Family history of adverse reaction to anesthesia     Mother has a hard time waking up   Past Surgical History  Procedure Laterality Date  . Ectopic pregnancy surgery    . Esophagogastroduodenoscopy N/A 08/22/2014    Procedure: ESOPHAGOGASTRODUODENOSCOPY (EGD);  Surgeon: Charna Elizabeth, MD;  Location: [facility];  Service: Endoscopy;  Laterality: N/A;   Family History  Problem Relation Age of Onset  . Migraines Mother   . Diabetes Father   . Breast cancer Paternal Aunt   . Cancer Paternal Grandmother     cervical   Social History  Substance Use Topics  . Smoking status: Former Smoker -- 0.50 packs/day    Types: Cigarettes  . Smokeless tobacco: None  . Alcohol Use: Yes     Comment: occasional   OB History    Gravida Para Term Preterm AB TAB SAB Ectopic Multiple Living   Review of Systems  Cardiovascular: Negative for chest pain.  Gastrointestinal: Negative for abdominal pain.  Genitourinary: Positive for flank pain.  All other systems reviewed and are negative.     Allergies  Aspirin;  Ciprofloxacin; and Ibuprofen  Home Medications   Prior to Admission medications   Medication Sig Start Date End Date Taking? Authorizing Provider  citalopram (CELEXA) 20 MG tablet Take 30 mg by mouth daily.     Historical Provider, MD  metoprolol tartrate (LOPRESSOR) 25 MG tablet Take 2 tablets (50 mg total) by mouth 2 (two) times daily. 08/24/14   Dorothea Ogle, MD  omeprazole (PRILOSEC) 40 MG capsule Take 40 mg by mouth daily.    Historical Provider, MD  oxyCODONE (OXY IR/ROXICODONE) 5 MG immediate release tablet Take 1 tablet (5 mg total) by mouth every 4 (four) hours as needed for moderate pain. Patient not taking: Reported on 09/12/2014 08/24/14   Dorothea Ogle, MD  pantoprazole (PROTONIX) 40 MG tablet Take 1 tablet (40 mg total) by mouth daily. 08/24/14   Dorothea Ogle, MD   BP 165/103 mmHg  Pulse 81  Temp(Src) 98.9 F (37.2 C) (Oral)  Resp 20  Ht  (1.803 m)  Wt 180 lb (81.647 kg)  BMI 25.12 kg/m2  SpO2 100%  LMP 05/31/2015 Physical Exam  Constitutional: She is oriented to person, place, and time. She appears well-developed and well-nourished. She appears distressed.  Rolling in bed, tearful crying out for pain medication and phenergan  HENT:  Head: Normocephalic.  Eyes: Conjunctivae are normal.  Neck: Neck supple. No tracheal deviation present.  Cardiovascular: Normal rate, regular  rhythm and normal heart sounds.   Pulmonary/Chest: Effort normal and breath sounds normal. No respiratory distress.  Abdominal: Soft. She exhibits no distension. There is tenderness in the right lower quadrant. There is CVA tenderness (on right). There is no rigidity, no rebound, no guarding and no tenderness at McBurney's point.  Neurological: She is alert and oriented to person, place, and time.  Skin: Skin is warm and dry.  Psychiatric: She has a normal mood and affect.    ED Course  Procedures (including critical care time)  Emergency Focused Ultrasound Exam Limited Retroperitoneal  Ultrasound of Kidneys  Performed and interpreted by Dr. Clydene PughKnott Focused abdominal ultrasound with both kidneys imaged in transverse and longitudinal planes in real-time. Indication: flank pain Findings: bilateral kidneys present, no shadowing, + anechoic areas on right with dilated ureter, no anechoic areas on left Interpretation: + moderate hydrouteronephrosis visualized no right.  no stones or cysts visualized  Images archived electronically  CPT Code: [id number]   Labs Review Labs Reviewed  URINALYSIS, ROUTINE W REFLEX MICROSCOPIC (NOT [facility]) - Abnormal; Notable for the following:    APPearance CLOUDY (*)    Hgb urine dipstick SMALL (*)    Ketones, ur 15 (*)    Leukocytes, UA SMALL (*)    All other components within normal limits  CBC WITH DIFFERENTIAL/PLATELET - Abnormal; Notable for the following:    WBC 14.3 (*)    Hemoglobin 8.9 (*)    HCT 30.1 (*)    MCV 73.6 (*)    MCH 21.8 (*)    MCHC 29.6 (*)    RDW 17.4 (*)    Platelets 469 (*)    Neutro Abs 10.3 (*)    Monocytes Absolute 1.1 (*)    All other components within normal limits  BASIC METABOLIC PANEL - Abnormal; Notable for the following:    Potassium 3.3 (*)    Glucose, Bld 144 (*)    Creatinine, Ser 1.11 (*)    All other components within normal limits  URINE MICROSCOPIC-ADD ON - Abnormal; Notable for the following:    Squamous Epithelial / LPF 0-5 (*)    Bacteria, UA RARE (*)    Crystals CA OXALATE CRYSTALS (*)    All other components within normal limits  URINE CULTURE  PREGNANCY, URINE    Imaging Review Ct Renal Stone Study  06/16/2015  CLINICAL DATA:  Acute onset right flank pain and nausea today. Nephrolithiasis. EXAM: CT ABDOMEN AND PELVIS WITHOUT CONTRAST TECHNIQUE: Multidetector CT imaging of the abdomen and pelvis was performed following the standard protocol without IV contrast. COMPARISON:  06/15/2014 FINDINGS: Lower chest:  No acute findings. Hepatobiliary: No mass visualized on this un-enhanced exam.  Pancreas: No mass or inflammatory process identified on this un-enhanced exam. Spleen: Within normal limits in size. Adrenals/Urinary Tract: No adrenal masses identified. Moderate right hydronephrosis is seen as well as right renal swelling and perinephric stranding. A 4 mm calcification is seen in the expected location of the distal right ureter on image 68 which was not seen on previous study. Other right pelvic calcifications present on previous study and likely represent phleboliths. Stomach/Bowel: No evidence of obstruction, inflammatory process, or abnormal fluid collections. Vascular/Lymphatic: No pathologically enlarged lymph nodes. No evidence of abdominal aortic aneurysm. Reproductive: 8 cm uterine fibroid shows no significant change since previous study. Small amount of free fluid is again noted within the pelvis. No definite adnexal mass visualized on this noncontrast study. Other: None. Musculoskeletal:  No suspicious bone lesions identified. IMPRESSION:  Moderate right hydronephrosis, renal swelling and perinephric stranding, with 4 mm distal right ureteral calculus. Other probable right pelvic phleboliths are unchanged since previous study. 8 cm uterine fibroid in small amount of free pelvic fluid, without significant change. Electronically Signed   By: Myles Rosenthal M.D.   On: 06/16/2015 14:20   I have personally reviewed and evaluated these images and lab results as part of my medical decision-making.   EKG Interpretation None      MDM   Final diagnoses:  Right flank pain  Hydronephrosis of right kidney  Obstruction of right ureteropelvic junction (UPJ) due to stone   39 y.o. female presents with sudden onset right flank pain that started earlier this afternoon and has been progressive in nature. She is clutching her right side in saying that her pain goes to her back. Has history of cyclic vomiting previously but during this episode pain began prior to emesis. Difficult IV access on  arrival and placed by nursing via ultrasound guidance. Bedside assessment demonstrates signs of right-sided ureteral obstruction presumably due to stone, given patient's severity of symptoms CT scan was ordered to localize and measure obstructive source.  The patient had moderate hydroureteronephrosis on the right with 4 mm obstructing stone in the distal ureter. Pain well controlled here. Given percocet , flomax, and antiemetics for home. No signs of infection currently or indications for emergent intervention. Provided information for urology follow up . Return precautions discussed for uncontrolled pain, inability to tolerate po, or signs of infection.     Lyndal Pulley, MD 06/17/15 [phone]

## 2015-06-16 NOTE — ED Notes (Signed)
MD at bedside. 

## 2015-06-17 LAB — URINE CULTURE

## 2016-10-04 ENCOUNTER — Encounter (HOSPITAL_BASED_OUTPATIENT_CLINIC_OR_DEPARTMENT_OTHER): Payer: Self-pay | Admitting: *Deleted

## 2016-10-04 ENCOUNTER — Emergency Department (HOSPITAL_BASED_OUTPATIENT_CLINIC_OR_DEPARTMENT_OTHER)
Admission: EM | Admit: 2016-10-04 | Discharge: 2016-10-04 | Disposition: A | Discharge status: Home / Self Care | Payer: Self-pay | Attending: Emergency Medicine | Admitting: Emergency Medicine

## 2016-10-04 DIAGNOSIS — M5416 Radiculopathy, lumbar region: Secondary | ICD-10-CM | POA: Insufficient documentation

## 2016-10-04 DIAGNOSIS — I10 Essential (primary) hypertension: Secondary | ICD-10-CM | POA: Insufficient documentation

## 2016-10-04 DIAGNOSIS — Z87891 Personal history of nicotine dependence: Secondary | ICD-10-CM | POA: Insufficient documentation

## 2016-10-04 DIAGNOSIS — Z79899 Other long term (current) drug therapy: Secondary | ICD-10-CM | POA: Insufficient documentation

## 2016-10-04 DIAGNOSIS — M5410 Radiculopathy, site unspecified: Secondary | ICD-10-CM

## 2016-10-04 MED ORDER — HYDROMORPHONE HCL 1 MG/ML IJ SOLN
2.0000 mg | Freq: Once | INTRAMUSCULAR | Status: AC
  Administered 2016-10-04: 2 mg via INTRAMUSCULAR
  Filled 2016-10-04: qty 2

## 2016-10-04 MED ORDER — HYDROCODONE-ACETAMINOPHEN 5-325 MG PO TABS: 1.0000 | ORAL_TABLET | Freq: Four times a day (QID) | ORAL | 0 refills | Status: DC | PRN

## 2016-10-04 MED FILL — HYDROCODON-APAP 5-325: 5-325 | 2 days supply | Qty: 12 | Fill #0

## 2016-10-04 NOTE — ED Provider Notes (Signed)
[facility] Provider Note   CSN: [id number] Arrival date & time: 10/04/16  0750     History   Chief Complaint Chief Complaint  Patient presents with  . Back Pain    HPI Shari Weeks is a 41 y.o. female.  Patient is a 41 year old female with history of reflux, migraines, hypertension, cyclic vomiting. She presents today for evaluation of severe low back pain. This started 2 days ago in the absence of any injury or trauma. She reports the pain radiates to the back of her right thigh. She denies any weakness or numbness. She denies any bowel or bladder complaints.   The history is provided by the patient.  Back Pain   This is a new problem. The current episode started 2 days ago. The problem occurs constantly. The problem has not changed since onset.The pain is associated with no known injury. The pain is present in the lumbar spine. The quality of the pain is described as stabbing. The pain radiates to the right thigh. The pain is at a severity of 10/10. The pain is severe. The symptoms are aggravated by bending, twisting and certain positions. Pertinent negatives include no bowel incontinence, no bladder incontinence, no tingling and no weakness. She has tried nothing for the symptoms.    Past Medical History:  Diagnosis Date  . Cyclic vomiting syndrome   . Diverticulitis   . Family history of adverse reaction to anesthesia    Mother has a hard time waking up  . GERD (gastroesophageal reflux disease)   . History of migraine   . Hypertension     Patient Active Problem List   Diagnosis Date Noted  . Cyclic vomiting syndrome 08/22/2014  . Hematemesis 08/22/2014  . Hematemesis with nausea 08/22/2014    Past Surgical History:  Procedure Laterality Date  . ECTOPIC PREGNANCY SURGERY    . ESOPHAGOGASTRODUODENOSCOPY N/A 08/22/2014   Procedure: ESOPHAGOGASTRODUODENOSCOPY (EGD);  Surgeon: Charna Elizabeth, MD;  Location: [facility];  Service: Endoscopy;  Laterality: N/A;     OB History    Gravida Para Term Preterm AB Living   3       3     SAB TAB Ectopic Multiple Live Births   1   2           Home Medications    Prior to Admission medications   Medication Sig Start Date End Date Taking? Authorizing Provider  omeprazole (PRILOSEC) 40 MG capsule Take 40 mg by mouth daily.    Historical Provider, MD  oxyCODONE (OXY IR/ROXICODONE) 5 MG immediate release tablet Take 1 tablet (5 mg total) by mouth every 4 (four) hours as needed for moderate pain. Patient not taking: Reported on 09/12/2014 08/24/14   Dorothea Ogle, MD  oxyCODONE-acetaminophen (PERCOCET/ROXICET) 5-325 MG tablet Take 1 tablet by mouth every 4 (four) hours as needed for severe pain. 06/16/15   Lyndal Pulley, MD    Family History Family History  Problem Relation Age of Onset  . Migraines Mother   . Diabetes Father   . Breast cancer Paternal Aunt   . Cancer Paternal Grandmother     cervical    Social History Social History  Substance Use Topics  . Smoking status: Former Smoker    Packs/day: 0.50    Types: Cigarettes  . Smokeless tobacco: Never Used  . Alcohol use Yes     Comment: occasional     Allergies   Aspirin; Ciprofloxacin; and Ibuprofen   Review of Systems Review of  Systems  Gastrointestinal: Negative for bowel incontinence.  Genitourinary: Negative for bladder incontinence.  Musculoskeletal: Positive for back pain.  Neurological: Negative for tingling and weakness.  All other systems reviewed and are negative.    Physical Exam Updated Vital Signs BP (!) 157/97 (BP Location: Right Arm)   Pulse 82   Temp 97.9 F (36.6 C) (Oral)   Resp 20   Ht 5\' 11"  (1.803 m)   Wt 145 lb (65.8 kg)   LMP 09/25/2016   SpO2 100%   BMI 20.22 kg/m   Physical Exam  Constitutional: She is oriented to person, place, and time. She appears well-developed and well-nourished. No distress.  Patient appears uncomfortable.  HENT:  Head: Normocephalic and atraumatic.  Neck: Normal  range of motion. Neck supple.  Cardiovascular: Normal rate and regular rhythm.  Exam reveals no gallop and no friction rub.   No murmur heard. Pulmonary/Chest: Effort normal and breath sounds normal. No respiratory distress. She has no wheezes.  Abdominal: Soft. Bowel sounds are normal. She exhibits no distension. There is no tenderness.  Musculoskeletal: Normal range of motion.  There is tenderness in the right lower lumbar soft tissues. Exam is somewhat limited secondary to patient discomfort.  Neurological: She is alert and oriented to person, place, and time.  Strength is 5 out of 5 in both lower extremities. DTRs are trace and symmetrical in the patellar and Achilles tendons bilaterally.  Skin: Skin is warm and dry. She is not diaphoretic.  Nursing note and vitals reviewed.    ED Treatments / Results  Labs (all labs ordered are listed, but only abnormal results are displayed) Labs Reviewed - No data to display  EKG  EKG Interpretation None       Radiology No results found.  Procedures Procedures (including critical care time)  Medications Ordered in ED Medications  HYDROmorphone (DILAUDID) injection 2 mg (not administered)     Initial Impression / Assessment and Plan / ED Course  I have reviewed the triage vital signs and the nursing notes.  Pertinent labs & imaging results that were available during my care of the patient were reviewed by me and considered in my medical decision making (see chart for details).  Symptoms are very musculoskeletal in nature. She has good reflexes, strength, and sensation. She will be treated with Aleve, hydrocodone, and is to follow-up if not improving in the next week. I see no indication for emergent imaging or consultation. She has good strength, symmetrical reflexes, and bowel and bladder function is preserved.  Final Clinical Impressions(s) / ED Diagnoses   Final diagnoses:  None    New Prescriptions New Prescriptions   No  medications on file     Geoffery Lyonsouglas Charon Smedberg, MD 10/04/16 [phone]

## 2016-10-04 NOTE — ED Notes (Signed)
ED Provider at bedside. 

## 2016-10-04 NOTE — Discharge Instructions (Signed)
Aleve 220 mg twice daily for the next week.  Hydrocodone as prescribed for pain not relieved with Aleve.  Follow up if your symptoms are not improving in the next week, or if your symptoms significantly worsen or change in the meantime.

## 2016-10-04 NOTE — ED Triage Notes (Signed)
Pt reports low back pain x 3 days, states she [facility] last night and waited 3 hours and was not seen. She denies any urinary sx, "I've had kidney stones and this is not them." pt reports intermittent spasms to low back. Pt states 3 years ago she ;pulled a muscle, "it felt just like this"

## 2017-05-23 ENCOUNTER — Encounter (HOSPITAL_COMMUNITY): Payer: Self-pay

## 2017-07-15 ENCOUNTER — Encounter (HOSPITAL_BASED_OUTPATIENT_CLINIC_OR_DEPARTMENT_OTHER): Payer: Self-pay | Admitting: *Deleted

## 2017-07-15 ENCOUNTER — Emergency Department (HOSPITAL_BASED_OUTPATIENT_CLINIC_OR_DEPARTMENT_OTHER)
Admission: EM | Admit: 2017-07-15 | Discharge: 2017-07-15 | Disposition: A | Discharge status: Home / Self Care | Payer: Self-pay | Attending: Emergency Medicine | Admitting: Emergency Medicine

## 2017-07-15 ENCOUNTER — Other Ambulatory Visit: Payer: Self-pay

## 2017-07-15 DIAGNOSIS — M5432 Sciatica, left side: Secondary | ICD-10-CM | POA: Insufficient documentation

## 2017-07-15 DIAGNOSIS — Z87891 Personal history of nicotine dependence: Secondary | ICD-10-CM | POA: Insufficient documentation

## 2017-07-15 DIAGNOSIS — Z79899 Other long term (current) drug therapy: Secondary | ICD-10-CM | POA: Insufficient documentation

## 2017-07-15 DIAGNOSIS — I10 Essential (primary) hypertension: Secondary | ICD-10-CM | POA: Insufficient documentation

## 2017-07-15 DIAGNOSIS — R3989 Other symptoms and signs involving the genitourinary system: Secondary | ICD-10-CM | POA: Insufficient documentation

## 2017-07-15 HISTORY — DX: Disorder of kidney and ureter, unspecified: N28.9

## 2017-07-15 HISTORY — DX: Cardiac murmur, unspecified: R01.1

## 2017-07-15 LAB — URINALYSIS, ROUTINE W REFLEX MICROSCOPIC
Bilirubin Urine: NEGATIVE
GLUCOSE, UA: NEGATIVE mg/dL
HGB URINE DIPSTICK: NEGATIVE
KETONES UR: NEGATIVE mg/dL
LEUKOCYTES UA: NEGATIVE
Nitrite: NEGATIVE
PH: 7.5 (ref 5.0–8.0)
Protein, ur: NEGATIVE mg/dL
Specific Gravity, Urine: 1.02 (ref 1.005–1.030)

## 2017-07-15 MED ORDER — METHOCARBAMOL 500 MG PO TABS: 500.0000 mg | ORAL_TABLET | Freq: Every evening | ORAL | 0 refills | Status: DC | PRN

## 2017-07-15 MED ORDER — METHOCARBAMOL 500 MG PO TABS
500.0000 mg | ORAL_TABLET | Freq: Once | ORAL | Status: AC
  Administered 2017-07-15: 500 mg via ORAL
  Filled 2017-07-15: qty 1

## 2017-07-15 MED ORDER — KETOROLAC TROMETHAMINE 15 MG/ML IJ SOLN
30.0000 mg | Freq: Once | INTRAMUSCULAR | Status: AC
  Administered 2017-07-15: 30 mg via INTRAMUSCULAR
  Filled 2017-07-15: qty 2

## 2017-07-15 MED ORDER — NAPROXEN 500 MG PO TABS: 500.0000 mg | ORAL_TABLET | Freq: Two times a day (BID) | ORAL | 0 refills | Status: DC

## 2017-07-15 MED ORDER — HYDROCODONE-ACETAMINOPHEN 5-325 MG PO TABS
1.0000 | ORAL_TABLET | Freq: Once | ORAL | Status: AC
  Administered 2017-07-15: 1 via ORAL
  Filled 2017-07-15: qty 1

## 2017-07-15 NOTE — Discharge Instructions (Signed)
As discussed, the medicine prescribed can help with muscle spasm but cannot be taken if driving, with alcohol or operating machinery.   Follow up with your Primary care provider or your orthopedic physician if symptoms  persist beyond a week.  Return if worsening or new concerning symptoms in the meantime.

## 2017-07-15 NOTE — ED Provider Notes (Signed)
[facility] EMERGENCY DEPARTMENT Provider Note   CSN: [id number] Arrival date & time: 07/15/17  [id number]     History   Chief Complaint Chief Complaint  Patient presents with  . Sciatica    Left buttock and leg pain    HPI Shari Weeks is a 41 y.o. female past medical history of hypertension and reflux presenting with 4 weeks of left-sided sciatic pain.  She has been seeing a chiropractor for the last 4 weeks and x-rays were obtained.  She was told that she was experiencing sciatic pain and that if her pain did not improve she would be referred to orthopedics.  She has a referral appointment with sports medicine in early January.  She reports worsening over the last 2 days, she is not able to sleep and the pain has been constant.  Her pain is aggravated by standing.  She is not finding any alleviating factors. She had a similar episode with muscle strain in the same area back in May where she improved until 4 weeks ago when the pain returned.  She denies any injury or trauma.  Her pain radiates from her left lower back through her buttocks and the back of her left thigh down to her knee. Also endorses a "funny urine smell" for the last 2 days.  Denies fever, chills, nausea, vomiting, abdominal pain, numbness, weakness, loss of bowel bladder function, history of malignancy, history of IV drug use.  HPI  Past Medical History:  Diagnosis Date  . Cyclic vomiting syndrome   . Diverticulitis   . Family history of adverse reaction to anesthesia    Mother has a hard time waking up  . GERD (gastroesophageal reflux disease)   . History of migraine   . Hypertension   . Murmur   . Renal disorder    Kidney stone    Patient Active Problem List   Diagnosis Date Noted  . Cyclic vomiting syndrome 08/22/2014  . Hematemesis 08/22/2014  . Hematemesis with nausea 08/22/2014    Past Surgical History:  Procedure Laterality Date  . ECTOPIC PREGNANCY SURGERY    .  ESOPHAGOGASTRODUODENOSCOPY N/A 08/22/2014   Procedure: ESOPHAGOGASTRODUODENOSCOPY (EGD);  Surgeon: Charna ElizabethJyothi Mann, MD;  Location: [facility];  Service: Endoscopy;  Laterality: N/A;    OB History    Gravida Para Term Preterm AB Living   3       3     SAB TAB Ectopic Multiple Live Births   1   2           Home Medications    Prior to Admission medications   Medication Sig Start Date End Date Taking? Authorizing Provider  acetaminophen (TYLENOL) 500 MG tablet Take 1,000 mg by mouth every 6 (six) hours as needed.   Yes [provider]  amLODipine (NORVASC) 10 MG tablet Take 10 mg by mouth daily.   Yes [provider]  naproxen sodium (ALEVE) 220 MG tablet Take 220 mg by mouth.   Yes [provider]  omeprazole (PRILOSEC) 40 MG capsule Take 40 mg by mouth daily.   Yes [provider]  HYDROcodone-acetaminophen (NORCO) 5-325 MG tablet Take 1-2 tablets by mouth every 6 (six) hours as needed. 10/04/16   Geoffery Lyonselo, Douglas, MD  methocarbamol (ROBAXIN) 500 MG tablet Take 1 tablet (500 mg total) by mouth at bedtime as needed for muscle spasms. 07/15/17   Mathews RobinsonsMitchell, Echo [place]sbrook B, PA-C  naproxen (NAPROSYN) 500 MG tablet Take 1 tablet (500 mg total) by mouth 2 (  two) times daily with a meal. 07/15/17   Mathews Robinsons B, PA-C  oxyCODONE (OXY IR/ROXICODONE) 5 MG immediate release tablet Take 1 tablet (5 mg total) by mouth every 4 (four) hours as needed for moderate pain. Patient not taking: Reported on 09/12/2014 08/24/14   Dorothea Ogle, MD  oxyCODONE-acetaminophen (PERCOCET/ROXICET) 5-325 MG tablet Take 1 tablet by mouth every 4 (four) hours as needed for severe pain. 06/16/15   Lyndal Pulley, MD    Family History Family History  Problem Relation Age of Onset  . Migraines Mother   . Diabetes Father   . Breast cancer Paternal Aunt   . Cancer Paternal Grandmother        cervical    Social History Social History   Tobacco Use  . Smoking status: Former Smoker     Packs/day: 0.50    Types: Cigarettes  . Smokeless tobacco: Never Used  Substance Use Topics  . Alcohol use: Yes    Comment: occasional  . Drug use: Yes    Frequency: 7.0 times per week    Types: Marijuana     Allergies   Aspirin; Ciprofloxacin; and Ibuprofen   Review of Systems Review of Systems  Constitutional: Negative for chills and fever.  Respiratory: Negative for cough and shortness of breath.   Cardiovascular: Negative for chest pain, palpitations and leg swelling.  Gastrointestinal: Negative for abdominal pain, diarrhea, nausea and vomiting.  Genitourinary: Negative for difficulty urinating, dysuria, flank pain, frequency, hematuria, pelvic pain, urgency and vaginal discharge.       Odor to her urine  Musculoskeletal: Positive for back pain and myalgias. Negative for arthralgias, joint swelling, neck pain and neck stiffness.  Skin: Negative for color change, pallor and rash.  Neurological: Negative for weakness and numbness.     Physical Exam Updated Vital Signs BP 119/89 (BP Location: Left Arm)   Pulse 75   Temp 98 F (36.7 C) (Oral)   Resp 18   Ht 5\' 11"  (1.803 m)   Wt 74.8 kg (165 lb)   LMP 07/15/2017 (Exact Date)   SpO2 100%   BMI 23.01 kg/m   Physical Exam  Constitutional: She appears well-developed and well-nourished. No distress.  Afebrile, nontoxic-appearing, sitting in bed in apparent discomfort, labile at times. no acute distress.  Neck: Normal range of motion. Neck supple.  Cardiovascular: Normal rate, regular rhythm, normal heart sounds and intact distal pulses.  No murmur heard. Pulmonary/Chest: Effort normal and breath sounds normal. No stridor. No respiratory distress. She has no wheezes.  Abdominal: Soft. Bowel sounds are normal. She exhibits no distension. There is no tenderness. There is no guarding.  Musculoskeletal: Normal range of motion. She exhibits tenderness. She exhibits no edema or deformity.  Left lower back musculature  tenderness and left gluteal  Neurological: She is alert. No sensory deficit. She exhibits normal muscle tone.  5/5 strength to lower extremities bilaterally. Patient is walking with a limp favoring the left leg  Skin: Skin is warm and dry. Capillary refill takes less than 2 seconds. No rash noted. She is not diaphoretic. No erythema. No pallor.  Psychiatric: She has a normal mood and affect.  Nursing note and vitals reviewed.    ED Treatments / Results  Labs (all labs ordered are listed, but only abnormal results are displayed) Labs Reviewed  URINALYSIS, ROUTINE W REFLEX MICROSCOPIC    EKG  EKG Interpretation None       Radiology No results found.  Procedures Procedures (including critical care time)  Medications Ordered in ED Medications  ketorolac (TORADOL) 15 MG/ML injection 30 mg (not administered)     Initial Impression / Assessment and Plan / ED Course  I have reviewed the triage vital signs and the nursing notes.  Pertinent labs & imaging results that were available during my care of the patient were reviewed by me and considered in my medical decision making (see chart for details).    Patient presents with lower back pain with left-sided sciatica.  No gross neurological deficits and normal neuro exam.  Patient has no concern for cauda equina.  No loss of bowel or bladder control, fever, night sweats, weight loss, h/o malignancy, or IVDU.    Patient's pain was managed while in the emergency department.  She is afebrile, nontoxic-appearing. UA negative  RICE protocol and pain medications indicated and discussed with patient.   Discharge home with symptomatic relief and close follow-up with PCP and Ortho.  Patient has a sports medicine appointment coming up.  Discussed strict return precautions and advised to return to the emergency department if experiencing any new or worsening symptoms. Instructions were understood and patient agreed with discharge  plan.  Final Clinical Impressions(s) / ED Diagnoses   Final diagnoses:  Sciatica of left side    ED Discharge Orders        Ordered    naproxen (NAPROSYN) 500 MG tablet  2 times daily with meals     07/15/17 0945    methocarbamol (ROBAXIN) 500 MG tablet  At bedtime PRN     07/15/17 0945       Georgiana ShoreMitchell, Johnmichael Melhorn B, PA-C 07/15/17 1318    Long, Arlyss RepressJoshua G, MD 07/16/17 [phone]

## 2017-07-15 NOTE — ED Triage Notes (Signed)
Patient states she has been seeing a Chiropractor for the last four weeks for left lower back pain with radiation into the left buttocks and posterior left leg.  Treatments have not helped, using OTC meds with no relief.  No known injury, had a similar episode earlier this year and was a pulled muscle.

## 2017-08-19 ENCOUNTER — Encounter (HOSPITAL_BASED_OUTPATIENT_CLINIC_OR_DEPARTMENT_OTHER): Payer: Self-pay | Admitting: *Deleted

## 2017-08-19 ENCOUNTER — Emergency Department (HOSPITAL_BASED_OUTPATIENT_CLINIC_OR_DEPARTMENT_OTHER): Payer: Self-pay

## 2017-08-19 ENCOUNTER — Other Ambulatory Visit: Payer: Self-pay

## 2017-08-19 ENCOUNTER — Inpatient Hospital Stay (HOSPITAL_BASED_OUTPATIENT_CLINIC_OR_DEPARTMENT_OTHER)
Admission: EM | Admit: 2017-08-19 | Discharge: 2017-08-21 | DRG: 369 | Disposition: A | Discharge status: Home / Self Care | Payer: Self-pay | Attending: Internal Medicine | Admitting: Internal Medicine

## 2017-08-19 DIAGNOSIS — F1210 Cannabis abuse, uncomplicated: Secondary | ICD-10-CM | POA: Diagnosis present

## 2017-08-19 DIAGNOSIS — K920 Hematemesis: Secondary | ICD-10-CM | POA: Diagnosis present

## 2017-08-19 DIAGNOSIS — K922 Gastrointestinal hemorrhage, unspecified: Secondary | ICD-10-CM

## 2017-08-19 DIAGNOSIS — K449 Diaphragmatic hernia without obstruction or gangrene: Secondary | ICD-10-CM | POA: Diagnosis present

## 2017-08-19 DIAGNOSIS — D509 Iron deficiency anemia, unspecified: Secondary | ICD-10-CM | POA: Diagnosis present

## 2017-08-19 DIAGNOSIS — Z87442 Personal history of urinary calculi: Secondary | ICD-10-CM

## 2017-08-19 DIAGNOSIS — R112 Nausea with vomiting, unspecified: Secondary | ICD-10-CM | POA: Diagnosis present

## 2017-08-19 DIAGNOSIS — N6002 Solitary cyst of left breast: Secondary | ICD-10-CM | POA: Diagnosis present

## 2017-08-19 DIAGNOSIS — T407X1A Poisoning by cannabis (derivatives), accidental (unintentional), initial encounter: Secondary | ICD-10-CM | POA: Diagnosis present

## 2017-08-19 DIAGNOSIS — K219 Gastro-esophageal reflux disease without esophagitis: Secondary | ICD-10-CM | POA: Diagnosis present

## 2017-08-19 DIAGNOSIS — Z8711 Personal history of peptic ulcer disease: Secondary | ICD-10-CM

## 2017-08-19 DIAGNOSIS — D500 Iron deficiency anemia secondary to blood loss (chronic): Secondary | ICD-10-CM | POA: Diagnosis present

## 2017-08-19 DIAGNOSIS — Z803 Family history of malignant neoplasm of breast: Secondary | ICD-10-CM

## 2017-08-19 DIAGNOSIS — R1115 Cyclical vomiting syndrome unrelated to migraine: Secondary | ICD-10-CM

## 2017-08-19 DIAGNOSIS — G43A0 Cyclical vomiting, not intractable: Secondary | ICD-10-CM | POA: Diagnosis present

## 2017-08-19 DIAGNOSIS — Z791 Long term (current) use of non-steroidal anti-inflammatories (NSAID): Secondary | ICD-10-CM

## 2017-08-19 DIAGNOSIS — F1290 Cannabis use, unspecified, uncomplicated: Secondary | ICD-10-CM

## 2017-08-19 DIAGNOSIS — K3189 Other diseases of stomach and duodenum: Secondary | ICD-10-CM | POA: Diagnosis present

## 2017-08-19 DIAGNOSIS — Z833 Family history of diabetes mellitus: Secondary | ICD-10-CM

## 2017-08-19 DIAGNOSIS — D62 Acute posthemorrhagic anemia: Secondary | ICD-10-CM | POA: Diagnosis present

## 2017-08-19 DIAGNOSIS — I10 Essential (primary) hypertension: Secondary | ICD-10-CM | POA: Diagnosis present

## 2017-08-19 DIAGNOSIS — K226 Gastro-esophageal laceration-hemorrhage syndrome: Principal | ICD-10-CM | POA: Diagnosis present

## 2017-08-19 LAB — CBC WITH DIFFERENTIAL/PLATELET
BASOS ABS: 0 10*3/uL (ref 0.0–0.1)
BASOS PCT: 0 %
EOS ABS: 0 10*3/uL (ref 0.0–0.7)
Eosinophils Relative: 0 %
HCT: 33.6 % — ABNORMAL LOW (ref 36.0–46.0)
Hemoglobin: 10.2 g/dL — ABNORMAL LOW (ref 12.0–15.0)
LYMPHS PCT: 19 %
Lymphs Abs: 2.3 10*3/uL (ref 0.7–4.0)
MCH: 23.4 pg — ABNORMAL LOW (ref 26.0–34.0)
MCHC: 30.4 g/dL (ref 30.0–36.0)
MCV: 77.1 fL — AB (ref 78.0–100.0)
MONO ABS: 0.8 10*3/uL (ref 0.1–1.0)
Monocytes Relative: 7 %
Neutro Abs: 8.8 10*3/uL — ABNORMAL HIGH (ref 1.7–7.7)
Neutrophils Relative %: 74 %
PLATELETS: 260 10*3/uL (ref 150–400)
RBC: 4.36 MIL/uL (ref 3.87–5.11)
RDW: 18.1 % — AB (ref 11.5–15.5)
WBC: 12 10*3/uL — AB (ref 4.0–10.5)

## 2017-08-19 MED ORDER — PROMETHAZINE HCL 25 MG/ML IJ SOLN
25.0000 mg | Freq: Once | INTRAMUSCULAR | Status: AC
  Administered 2017-08-19: 25 mg via INTRAVENOUS
  Filled 2017-08-19: qty 1

## 2017-08-19 MED ORDER — ONDANSETRON 4 MG PO TBDP
4.0000 mg | ORAL_TABLET | Freq: Once | ORAL | Status: AC
  Administered 2017-08-19: 4 mg via ORAL
  Filled 2017-08-19: qty 1

## 2017-08-19 MED ORDER — SODIUM CHLORIDE 0.9 % IV SOLN
80.0000 mg | Freq: Once | INTRAVENOUS | Status: AC
  Administered 2017-08-20: 80 mg via INTRAVENOUS
  Filled 2017-08-19: qty 80

## 2017-08-19 MED ORDER — SODIUM CHLORIDE 0.9 % IV BOLUS (SEPSIS): 1000.0000 mL | Freq: Once | INTRAVENOUS | Status: DC

## 2017-08-19 MED ORDER — SODIUM CHLORIDE 0.9 % IV SOLN
INTRAVENOUS | Status: DC
  Administered 2017-08-20: via INTRAVENOUS

## 2017-08-19 MED ORDER — PANTOPRAZOLE SODIUM 40 MG IV SOLR
INTRAVENOUS | Status: AC
  Filled 2017-08-19: qty 160

## 2017-08-19 MED ORDER — SODIUM CHLORIDE 0.9 % IV SOLN
8.0000 mg/h | INTRAVENOUS | Status: DC
  Administered 2017-08-20 (×2): 8 mg/h via INTRAVENOUS
  Filled 2017-08-19 (×3): qty 80

## 2017-08-19 MED ORDER — SODIUM CHLORIDE 0.9 % IV BOLUS (SEPSIS)
1000.0000 mL | Freq: Once | INTRAVENOUS | Status: AC
  Administered 2017-08-19: 1000 mL via INTRAVENOUS

## 2017-08-19 NOTE — ED Provider Notes (Signed)
[facility] EMERGENCY DEPARTMENT Provider Note   CSN: [id number] Arrival date & time: 08/19/17  2107     History   Chief Complaint Chief Complaint  Patient presents with  . Emesis    HPI Shari Weeks is a 42 y.o. female.  HPI  Patient is a 42 year old female with a history of cyclic vomiting syndrome, hypertension, and Mallory-Weiss tear and no abdominal surgical history treated conservatively presenting for recurrent vomiting and soft stools for 2 days.  Patient reports that this is similar to prior episodes of similar presentation.  Patient reports that she has had 5-6 episodes of soft stool today without melena or hematochezia.  Patient also reports that she has had emesis greater than 10 times per minute with coffee-ground coloration but no bright red blood.  Patient reports she had this same discoloration the last time she was diagnosed with a Mallory-Weiss tear.  Patient denies fevers, chills, or abdominal pain outside the setting of her vomiting.  Patient reports that she is getting epigastric pain before and after vomiting, but if she gets a break in between the vomiting, the epigastric pain improves.  Pain does not radiate into the back.  Patient reports she is not urinated today.  No dysuria.  Patient reports she has not been able to keep down any fluids or solids for the past 24 hours, and reports that the Zofran she was taking at home is not working.  Patient does report that she has been taking daily ibuprofen for her herniated disc in her back.  Patient denies any alcohol use.  Patient reports last marijuana use was 2 weeks ago.  Past Medical History:  Diagnosis Date  . Cyclic vomiting syndrome   . Diverticulitis   . Family history of adverse reaction to anesthesia    Mother has a hard time waking up  . GERD (gastroesophageal reflux disease)   . History of migraine   . Hypertension   . Murmur   . Renal disorder    Kidney stone    Patient Active Problem  List   Diagnosis Date Noted  . Cyclic vomiting syndrome 08/22/2014  . Hematemesis 08/22/2014  . Hematemesis with nausea 08/22/2014    Past Surgical History:  Procedure Laterality Date  . ECTOPIC PREGNANCY SURGERY    . ESOPHAGOGASTRODUODENOSCOPY N/A 08/22/2014   Procedure: ESOPHAGOGASTRODUODENOSCOPY (EGD);  Surgeon: Charna Elizabeth, MD;  Location: [facility] ENDOSCOPY;  Service: Endoscopy;  Laterality: N/A;    OB History    Gravida Para Term Preterm AB Living   3       3     SAB TAB Ectopic Multiple Live Births   1   2           Home Medications    Prior to Admission medications   Medication Sig Start Date End Date Taking? Authorizing Provider  acetaminophen (TYLENOL) 500 MG tablet Take 1,000 mg by mouth every 6 (six) hours as needed.   Yes [provider]  amLODipine (NORVASC) 10 MG tablet Take 10 mg by mouth daily.   Yes [provider]  esomeprazole (NEXIUM) 40 MG capsule Take 40 mg by mouth daily at 12 noon.   Yes [provider]  HYDROcodone-acetaminophen (NORCO) 5-325 MG tablet Take 1-2 tablets by mouth every 6 (six) hours as needed. 10/04/16   Geoffery Lyons, MD  methocarbamol (ROBAXIN) 500 MG tablet Take 1 tablet (500 mg total) by mouth at bedtime as needed for muscle spasms. 07/15/17   Georgiana Shore,  PA-C  naproxen (NAPROSYN) 500 MG tablet Take 1 tablet (500 mg total) by mouth 2 (two) times daily with a meal. 07/15/17   Mathews Robinsons B, PA-C  naproxen sodium (ALEVE) 220 MG tablet Take 220 mg by mouth.    [provider]  omeprazole (PRILOSEC) 40 MG capsule Take 40 mg by mouth daily.    [provider]  oxyCODONE (OXY IR/ROXICODONE) 5 MG immediate release tablet Take 1 tablet (5 mg total) by mouth every 4 (four) hours as needed for moderate pain. Patient not taking: Reported on 09/12/2014 08/24/14   Dorothea Ogle, MD  oxyCODONE-acetaminophen (PERCOCET/ROXICET) 5-325 MG tablet Take 1 tablet by mouth every 4 (four) hours as needed for  severe pain. 06/16/15   Lyndal Pulley, MD    Family History Family History  Problem Relation Age of Onset  . Migraines Mother   . Diabetes Father   . Breast cancer Paternal Aunt   . Cancer Paternal Grandmother        cervical    Social History Social History   Tobacco Use  . Smoking status: Former Smoker    Packs/day: 0.50    Types: Cigarettes  . Smokeless tobacco: Never Used  Substance Use Topics  . Alcohol use: Yes    Comment: occasional  . Drug use: Yes    Frequency: 7.0 times per week    Types: Marijuana     Allergies   Aspirin; Ciprofloxacin; and Ibuprofen   Review of Systems Review of Systems  Constitutional: Negative for chills and fever.  Respiratory: Negative for cough and shortness of breath.   Cardiovascular: Negative for chest pain.  Gastrointestinal: Positive for abdominal pain, diarrhea, nausea and vomiting. Negative for blood in stool.  Genitourinary: Negative for dysuria and pelvic pain.  Musculoskeletal: Negative for myalgias.  Skin: Negative for rash.  Neurological: Negative for syncope.  All other systems reviewed and are negative.    Physical Exam Updated Vital Signs BP (!) 125/92 (BP Location: Right Arm)   Pulse (!) 104   Temp 98.5 F (36.9 C) (Oral)   Resp 20   Ht 5\' 11"  (1.803 m)   Wt 72.6 kg (160 lb)   LMP 08/16/2017   SpO2 99%   BMI 22.32 kg/m   Physical Exam  Constitutional: She appears well-developed and well-nourished. No distress.  HENT:  Head: Normocephalic and atraumatic.  Mouth/Throat: Oropharynx is clear and moist.  Eyes: Conjunctivae and EOM are normal. Pupils are equal, round, and reactive to light.  Neck: Normal range of motion. Neck supple.  Cardiovascular: Normal rate, regular rhythm, S1 normal and S2 normal.  No murmur heard. Pulse 88 on my examination.  Pulmonary/Chest: Effort normal and breath sounds normal. She has no wheezes. She has no rales.  Abdominal: Soft. She exhibits no distension. There is no  tenderness. There is no guarding.  Epigastric tenderness to palpation.  No rebound or guarding. Active coffee-ground emesis on examination.  Musculoskeletal: Normal range of motion. She exhibits no edema or deformity.  Lymphadenopathy:    She has no cervical adenopathy.  Neurological: She is alert.  Cranial nerves grossly intact. Patient moves extremities symmetrically and with good coordination.  Skin: Skin is warm and dry. No rash noted. No erythema.  Psychiatric: She has a normal mood and affect. Her behavior is normal. Judgment and thought content normal.  Nursing note and vitals reviewed.    ED Treatments / Results  Labs (all labs ordered are listed, but only abnormal results are displayed) Labs  Reviewed  URINALYSIS, ROUTINE W REFLEX MICROSCOPIC  PREGNANCY, URINE  COMPREHENSIVE METABOLIC PANEL  CBC WITH DIFFERENTIAL/PLATELET  POCT GASTRIC OCCULT BLOOD (1-CARD TO LAB)    EKG  EKG Interpretation None       Radiology No results found.  Procedures Procedures (including critical care time)  CRITICAL CARE Performed by: Elisha PonderAlyssa B Joaquin Knebel  Total critical care time: 35 minutes (Upper GI bleed requiring PPI infusion)  Critical care time was exclusive of separately billable procedures and treating other patients.  Critical care was necessary to treat or prevent imminent or life-threatening deterioration.  Critical care was time spent personally by me on the following activities: development of treatment plan with patient and/or surrogate as well as nursing, discussions with consultants, evaluation of patient's response to treatment, examination of patient, obtaining history from patient or surrogate, ordering and performing treatments and interventions, ordering and review of laboratory studies, ordering and review of radiographic studies, pulse oximetry and re-evaluation of patient's condition.  Medications Ordered in ED Medications  promethazine (PHENERGAN) injection 25  mg (not administered)  pantoprazole (PROTONIX) 80 mg in sodium chloride 0.9 % 100 mL IVPB (not administered)  pantoprazole (PROTONIX) 80 mg in sodium chloride 0.9 % 250 mL (0.32 mg/mL) infusion (not administered)  sodium chloride 0.9 % bolus 1,000 mL (not administered)    And  0.9 %  sodium chloride infusion (not administered)  ondansetron (ZOFRAN-ODT) disintegrating tablet 4 mg (4 mg Oral Given 08/19/17 2232)     Initial Impression / Assessment and Plan / ED Course  I have reviewed the triage vital signs and the nursing notes.  Pertinent labs & imaging results that were available during my care of the patient were reviewed by me and considered in my medical decision making (see chart for details).     Final Clinical Impressions(s) / ED Diagnoses   Final diagnoses:  GI bleed  Non-intractable cyclical vomiting with nausea   Patient is nontoxic-appearing but uncomfortable.  Patient is having active emesis on my examination that is occurring each minute.  Patient is having active coffee-ground emesis concerning for upper GI bleed.  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  Will obtain chest x-ray to assess for free air.  Will also obtain CMP, CBC, lipase, urinalysis, urine pregnancy.  Patient reports she would prefer admission to [facility].  Hemoglobin is stable at 10.2, and consistent with prior.  No hypokalemia.  Patient's chest x-ray demonstrates no free air.  Per lab, gastric occult is positive and pH is 3.  [facility] has no appropriate beds for patient at this time.  [facility] and transfer of patient to appropriate [facility] facility.  Will place cons[facility]. Patient updated on plan of care, and is understanding and agreement.  Nausea controlled at this time with Phenergan.  Patient is having epigastric pain, and  requesting analgesia.  Will order 4 mg of morphine.  1:47 AM Spoke with Dr. Toniann FailKakrakandy of [facility] who will admit the patient for upper GI bleed. Requests UDS be ordered.   This is a shared visit with Dr. Baxter HireKristen Ward. Patient was independently evaluated by this attending physician. Attending physician consulted in evaluation and admission management.   ED Discharge Orders    None       Elisha PonderMurray, Dannica Bickham B, [place]PA-C 08/20/17 0155

## 2017-08-19 NOTE — ED Triage Notes (Signed)
Pt reports diarrhea yesterday. Only one episode today but has been vomiting continuously today. States her nephews have had similar Sx

## 2017-08-19 NOTE — ED Notes (Signed)
IV attempt x 1 without success.

## 2017-08-20 ENCOUNTER — Encounter (HOSPITAL_COMMUNITY)
Admission: EM | Disposition: A | Discharge status: Home / Self Care | Payer: Self-pay | Source: Home / Self Care | Attending: Internal Medicine

## 2017-08-20 ENCOUNTER — Observation Stay (HOSPITAL_COMMUNITY): Payer: Self-pay | Admitting: Anesthesiology

## 2017-08-20 ENCOUNTER — Encounter (HOSPITAL_COMMUNITY): Payer: Self-pay | Admitting: Internal Medicine

## 2017-08-20 ENCOUNTER — Other Ambulatory Visit: Payer: Self-pay

## 2017-08-20 DIAGNOSIS — F1290 Cannabis use, unspecified, uncomplicated: Secondary | ICD-10-CM

## 2017-08-20 DIAGNOSIS — D509 Iron deficiency anemia, unspecified: Secondary | ICD-10-CM

## 2017-08-20 DIAGNOSIS — I10 Essential (primary) hypertension: Secondary | ICD-10-CM | POA: Diagnosis present

## 2017-08-20 DIAGNOSIS — K254 Chronic or unspecified gastric ulcer with hemorrhage: Secondary | ICD-10-CM

## 2017-08-20 DIAGNOSIS — K920 Hematemesis: Secondary | ICD-10-CM

## 2017-08-20 DIAGNOSIS — G43A0 Cyclical vomiting, not intractable: Secondary | ICD-10-CM

## 2017-08-20 DIAGNOSIS — R112 Nausea with vomiting, unspecified: Secondary | ICD-10-CM | POA: Diagnosis present

## 2017-08-20 HISTORY — PX: ESOPHAGOGASTRODUODENOSCOPY (EGD) WITH PROPOFOL: SHX5813

## 2017-08-20 LAB — FOLATE: Folate: 14.7 ng/mL (ref >5.9)

## 2017-08-20 LAB — COMPREHENSIVE METABOLIC PANEL
ALK PHOS: 61 U/L (ref 38–126)
ALT: 13 U/L — AB (ref 14–54)
ALT: 15 U/L (ref 14–54)
AST: 15 U/L (ref 15–41)
AST: 17 U/L (ref 15–41)
Albumin: 3.9 g/dL (ref 3.5–5.0)
Albumin: 4.4 g/dL (ref 3.5–5.0)
Alkaline Phosphatase: 51 U/L (ref 38–126)
Anion gap: 13 (ref 5–15)
Anion gap: 4 — ABNORMAL LOW (ref 5–15)
BILIRUBIN TOTAL: 0.5 mg/dL (ref 0.3–1.2)
BILIRUBIN TOTAL: 0.5 mg/dL (ref 0.3–1.2)
BUN: 19 mg/dL (ref 6–20)
BUN: 26 mg/dL — ABNORMAL HIGH (ref 6–20)
CALCIUM: 8.5 mg/dL — AB (ref 8.9–10.3)
CALCIUM: 9.5 mg/dL (ref 8.9–10.3)
CO2: 24 mmol/L (ref 22–32)
CO2: 25 mmol/L (ref 22–32)
CREATININE: 0.74 mg/dL (ref 0.44–1.00)
Chloride: 100 mmol/L — ABNORMAL LOW (ref 101–111)
Chloride: 110 mmol/L (ref 101–111)
Creatinine, Ser: 1.03 mg/dL — ABNORMAL HIGH (ref 0.44–1.00)
GFR calc Af Amer: >60 mL/min (ref >60)
GFR calc Af Amer: >60 mL/min (ref >60)
Glucose, Bld: 128 mg/dL — ABNORMAL HIGH (ref 65–99)
Glucose, Bld: 160 mg/dL — ABNORMAL HIGH (ref 65–99)
POTASSIUM: 3.9 mmol/L (ref 3.5–5.1)
POTASSIUM: 3.6 mmol/L (ref 3.5–5.1)
Sodium: 137 mmol/L (ref 135–145)
Sodium: 139 mmol/L (ref 135–145)
TOTAL PROTEIN: 6.8 g/dL (ref 6.5–8.1)
TOTAL PROTEIN: 8.2 g/dL — AB (ref 6.5–8.1)

## 2017-08-20 LAB — HEMOGLOBIN AND HEMATOCRIT, BLOOD
HCT: 31 % — ABNORMAL LOW (ref 36.0–46.0)
HCT: 25.9 % — ABNORMAL LOW (ref 36.0–46.0)
HEMATOCRIT: 31.2 % — AB (ref 36.0–46.0)
HEMOGLOBIN: 9.3 g/dL — AB (ref 12.0–15.0)
HEMOGLOBIN: 8 g/dL — AB (ref 12.0–15.0)
Hemoglobin: 9.3 g/dL — ABNORMAL LOW (ref 12.0–15.0)

## 2017-08-20 LAB — RAPID URINE DRUG SCREEN, HOSP PERFORMED
AMPHETAMINES: NOT DETECTED
Barbiturates: NOT DETECTED
Benzodiazepines: NOT DETECTED
Cocaine: NOT DETECTED
Opiates: POSITIVE — AB
TETRAHYDROCANNABINOL: POSITIVE — AB

## 2017-08-20 LAB — CBC
HEMATOCRIT: 28.3 % — AB (ref 36.0–46.0)
Hemoglobin: 8.9 g/dL — ABNORMAL LOW (ref 12.0–15.0)
MCH: 24.1 pg — ABNORMAL LOW (ref 26.0–34.0)
MCHC: 31.4 g/dL (ref 30.0–36.0)
MCV: 76.7 fL — AB (ref 78.0–100.0)
Platelets: 371 10*3/uL (ref 150–400)
RBC: 3.69 MIL/uL — ABNORMAL LOW (ref 3.87–5.11)
RDW: 18 % — AB (ref 11.5–15.5)
WBC: 9.4 10*3/uL (ref 4.0–10.5)

## 2017-08-20 LAB — POCT GASTRIC OCCULT BLOOD (1-CARD TO LAB): OCCULT BLOOD, GASTRIC: POSITIVE — AB

## 2017-08-20 LAB — GASTRIC OCCULT BLOOD (1-CARD TO LAB): pH, Gastric: 3

## 2017-08-20 LAB — TYPE AND SCREEN
ABO/RH(D): O POS
Antibody Screen: NEGATIVE

## 2017-08-20 LAB — IRON AND TIBC
Iron: 16 ug/dL — ABNORMAL LOW (ref 28–170)
Saturation Ratios: 4 % — ABNORMAL LOW (ref 10.4–31.8)
TIBC: 407 ug/dL (ref 250–450)
UIBC: 391 ug/dL

## 2017-08-20 LAB — LIPASE, BLOOD: Lipase: 23 U/L (ref 11–51)

## 2017-08-20 LAB — GLUCOSE, CAPILLARY
GLUCOSE-CAPILLARY: 117 mg/dL — AB (ref 65–99)
GLUCOSE-CAPILLARY: 78 mg/dL (ref 65–99)
Glucose-Capillary: 108 mg/dL — ABNORMAL HIGH (ref 65–99)
Glucose-Capillary: 119 mg/dL — ABNORMAL HIGH (ref 65–99)

## 2017-08-20 LAB — RETICULOCYTES
RBC.: 3.97 MIL/uL (ref 3.87–5.11)
RETIC COUNT ABSOLUTE: 31.8 10*3/uL (ref 19.0–186.0)
RETIC CT PCT: 0.8 % (ref 0.4–3.1)

## 2017-08-20 LAB — FERRITIN: FERRITIN: 13 ng/mL (ref 11–307)

## 2017-08-20 LAB — HCG, SERUM, QUALITATIVE: PREG SERUM: NEGATIVE

## 2017-08-20 LAB — VITAMIN B12: Vitamin B-12: 583 pg/mL (ref 180–914)

## 2017-08-20 SURGERY — ESOPHAGOGASTRODUODENOSCOPY (EGD) WITH PROPOFOL
Anesthesia: Monitor Anesthesia Care | Laterality: Left

## 2017-08-20 MED ORDER — ONDANSETRON HCL 4 MG/2ML IJ SOLN
4.0000 mg | Freq: Once | INTRAMUSCULAR | Status: AC
  Administered 2017-08-20: 4 mg via INTRAVENOUS

## 2017-08-20 MED ORDER — LIDOCAINE 2% (20 MG/ML) 5 ML SYRINGE
INTRAMUSCULAR | Status: DC | PRN
  Administered 2017-08-20: 100 mg via INTRAVENOUS

## 2017-08-20 MED ORDER — INFLUENZA VAC SPLIT QUAD 0.5 ML IM SUSY: 0.5000 mL | PREFILLED_SYRINGE | INTRAMUSCULAR | Status: DC

## 2017-08-20 MED ORDER — PROMETHAZINE HCL 25 MG/ML IJ SOLN
12.5000 mg | Freq: Four times a day (QID) | INTRAMUSCULAR | Status: DC | PRN
  Administered 2017-08-20: 12.5 mg via INTRAVENOUS
  Filled 2017-08-20: qty 1

## 2017-08-20 MED ORDER — PROPOFOL 500 MG/50ML IV EMUL
INTRAVENOUS | Status: DC | PRN
  Administered 2017-08-20: 125 ug/kg/min via INTRAVENOUS

## 2017-08-20 MED ORDER — METOCLOPRAMIDE HCL 5 MG/ML IJ SOLN
10.0000 mg | Freq: Once | INTRAMUSCULAR | Status: AC
  Administered 2017-08-20: 10 mg via INTRAVENOUS
  Filled 2017-08-20: qty 2

## 2017-08-20 MED ORDER — MORPHINE SULFATE (PF) 2 MG/ML IV SOLN
1.0000 mg | INTRAVENOUS | Status: DC | PRN
  Administered 2017-08-20 – 2017-08-21 (×5): 1 mg via INTRAVENOUS
  Filled 2017-08-20 (×5): qty 1

## 2017-08-20 MED ORDER — PROPOFOL 10 MG/ML IV BOLUS
INTRAVENOUS | Status: DC | PRN
  Administered 2017-08-20: 100 mg via INTRAVENOUS
  Administered 2017-08-20: 50 mg via INTRAVENOUS
  Administered 2017-08-20: 100 mg via INTRAVENOUS

## 2017-08-20 MED ORDER — PANTOPRAZOLE SODIUM 40 MG IV SOLR
40.0000 mg | INTRAVENOUS | Status: DC
Start: 2017-08-20 — End: 2017-08-21
  Administered 2017-08-20: 40 mg via INTRAVENOUS
  Filled 2017-08-20: qty 40

## 2017-08-20 MED ORDER — PROPOFOL 10 MG/ML IV BOLUS
INTRAVENOUS | Status: AC
  Filled 2017-08-20: qty 40

## 2017-08-20 MED ORDER — ACETAMINOPHEN 325 MG PO TABS
650.0000 mg | ORAL_TABLET | Freq: Four times a day (QID) | ORAL | Status: DC | PRN
  Administered 2017-08-21: 650 mg via ORAL
  Filled 2017-08-20: qty 2

## 2017-08-20 MED ORDER — ONDANSETRON HCL 4 MG PO TABS: 4.0000 mg | ORAL_TABLET | Freq: Four times a day (QID) | ORAL | Status: DC | PRN

## 2017-08-20 MED ORDER — PROPOFOL 10 MG/ML IV BOLUS
INTRAVENOUS | Status: AC
  Filled 2017-08-20: qty 20

## 2017-08-20 MED ORDER — PROMETHAZINE HCL 25 MG/ML IJ SOLN
12.5000 mg | Freq: Four times a day (QID) | INTRAMUSCULAR | Status: DC | PRN
Start: 2017-08-20 — End: 2017-08-20

## 2017-08-20 MED ORDER — ONDANSETRON HCL 4 MG/2ML IJ SOLN
4.0000 mg | Freq: Four times a day (QID) | INTRAMUSCULAR | Status: DC | PRN
  Administered 2017-08-20 – 2017-08-21 (×2): 4 mg via INTRAVENOUS
  Filled 2017-08-20: qty 2

## 2017-08-20 MED ORDER — MORPHINE SULFATE (PF) 4 MG/ML IV SOLN
4.0000 mg | Freq: Once | INTRAVENOUS | Status: AC
  Administered 2017-08-20: 4 mg via INTRAVENOUS
  Filled 2017-08-20: qty 1

## 2017-08-20 MED ORDER — SODIUM CHLORIDE 0.9 % IV SOLN
INTRAVENOUS | Status: DC
  Administered 2017-08-20: 09:00:00 via INTRAVENOUS

## 2017-08-20 MED ORDER — ONDANSETRON HCL 4 MG/2ML IJ SOLN
INTRAMUSCULAR | Status: AC
  Filled 2017-08-20: qty 2

## 2017-08-20 MED ORDER — SODIUM CHLORIDE 0.9 % IV SOLN: INTRAVENOUS | Status: DC

## 2017-08-20 MED ORDER — HYDRALAZINE HCL 20 MG/ML IJ SOLN: 10.0000 mg | INTRAMUSCULAR | Status: DC | PRN

## 2017-08-20 MED ORDER — LACTATED RINGERS IV SOLN
INTRAVENOUS | Status: DC | PRN
  Administered 2017-08-20: 14:00:00 via INTRAVENOUS

## 2017-08-20 MED ORDER — ACETAMINOPHEN 650 MG RE SUPP: 650.0000 mg | Freq: Four times a day (QID) | RECTAL | Status: DC | PRN

## 2017-08-20 MED ORDER — LACTATED RINGERS IV SOLN: INTRAVENOUS | Status: DC

## 2017-08-20 MED ORDER — ONDANSETRON HCL 4 MG/2ML IJ SOLN
4.0000 mg | Freq: Four times a day (QID) | INTRAMUSCULAR | Status: DC | PRN
  Administered 2017-08-20: 4 mg via INTRAVENOUS
  Filled 2017-08-20 (×2): qty 2

## 2017-08-20 NOTE — Anesthesia Preprocedure Evaluation (Addendum)
Anesthesia Evaluation  Patient identified by MRN, date of birth, ID band Patient awake    Reviewed: Allergy & Precautions, NPO status , Patient's Chart, lab work & pertinent test results  Airway Mallampati: I  TM Distance: >3 FB Neck ROM: Full    Dental  (+) Teeth Intact, Dental Advisory Given, Chipped,    Pulmonary former smoker,    breath sounds clear to auscultation       Cardiovascular hypertension, Pt. on medications + Valvular Problems/Murmurs  Rhythm:Regular Rate:Normal     Neuro/Psych negative neurological ROS  negative psych ROS   GI/Hepatic Neg liver ROS, GERD  Medicated,  Endo/Other  negative endocrine ROS  Renal/GU Renal disease     Musculoskeletal negative musculoskeletal ROS (+)   Abdominal Normal abdominal exam  (+)   Peds  Hematology   Anesthesia Other Findings   Reproductive/Obstetrics negative OB ROS                            Anesthesia Physical Anesthesia Plan  ASA: II  Anesthesia Plan: MAC   Post-op Pain Management:    Induction: Intravenous  PONV Risk Score and Plan: 3 and Propofol infusion and Ondansetron  Airway Management Planned: Natural Airway and Nasal Cannula  Additional Equipment: None  Intra-op Plan:   Post-operative Plan:   Informed Consent: I have reviewed the patients History and Physical, chart, labs and discussed the procedure including the risks, benefits and alternatives for the proposed anesthesia with the patient or authorized representative who has indicated his/her understanding and acceptance.   Dental advisory given  Plan Discussed with: CRNA  Anesthesia Plan Comments:         Anesthesia Quick Evaluation

## 2017-08-20 NOTE — Brief Op Note (Signed)
08/19/2017 - 08/20/2017  2:19 PM  PATIENT:  Shari Weeks  42 y.o. female  PRE-OPERATIVE DIAGNOSIS:  Hemetemesis, nausea and vomiting  POST-OPERATIVE DIAGNOSIS:  mallory weiss tear, hiatal hernia, gastritis, duodenitis  PROCEDURE:  Procedure(s): ESOPHAGOGASTRODUODENOSCOPY (EGD) WITH PROPOFOL (Left)  SURGEON:  Surgeon(s) and Role:    Ronnette Juniper, MD - Primary  PHYSICIAN ASSISTANT: None  ASSISTANTS: Hayden Rasmussen, Hope Jerline Pain, Tech, Stephanie [place] CRNA  ANESTHESIA:   MAC  EBL:  none   BLOOD ADMINISTERED:none  DRAINS: none   LOCAL MEDICATIONS USED:  NONE  SPECIMEN:  No Specimen  DISPOSITION OF SPECIMEN:  N/A  COUNTS:  YES  TOURNIQUET:  * No tourniquets in log *  DICTATION: .Dragon Dictation  PLAN OF CARE: Admit to inpatient   PATIENT DISPOSITION:  PACU - hemodynamically stable.   Delay start of Pharmacological VTE agent (>24hrs) due to surgical blood loss or risk of bleeding: no

## 2017-08-20 NOTE — Op Note (Signed)
EGD was performed for hematemesis.  A diminutive 3 mm nonbleeding Mallory-Weiss tear was found at the GE junction, otherwise the Z line appeared regular at 38 cm.  4 centimeter hiatal hernia was noted. Moderately erythematous mucosa was found to in the cardia likely related to frequent retching. Diffuse moderate erythematous and inflamed mucosa without bleeding was noted in the duodenal bulb and first portion of the duodenum.   Recommendation: Advance diet as tolerated. Avoid NSAIDs if possible, PPI daily, avoid marijuana use as patient has cyclical vomiting syndrome(likely cause of Mallory-Weiss tear from frequent retching).   GI will sign off, please recall if needed.  Kerin SalenArya Charlestine Rookstool, M.D.

## 2017-08-20 NOTE — ED Notes (Addendum)
Sleeping/ resting, arousable to voice, NAD, calm, interactive, resps e/u, speaking clearly, no dyspnea noted, skin W&D, VSS, c/o pain and nausea.

## 2017-08-20 NOTE — ED Notes (Signed)
Pt in xray

## 2017-08-20 NOTE — Progress Notes (Signed)
MD notified that pts has arrived to unit. Request for pain medication and to address telemetry order as we do not have telemetry on this unit.

## 2017-08-20 NOTE — H&P (Signed)
History and Physical    Shari Weeks HYQ:[id number] DOB: 03/19/1976 DOA: 08/19/2017  PCP: Sydnee CabalShearin, Mary D., MD  Patient coming from: Home.  Chief Complaint: Nausea vomiting.  HPI: Shari Weeks is a 42 y.o. female with history of hypertension and previous episodes requiring admissions for intractable nausea vomiting presents [facility] with intractable nausea vomiting for last 3 days.  Unable to keep anything.  Also had occasional diarrhea.  Patient also has benign crampy abdominal pain.  Patient states in August last year she was admitted at [facility] for similar complaints and was found to have gastric ulcer.  Reviewing care everywhere I do not find any EGD results except for in 2013 which was not showing anything acute.  ED Course: In the ER patient had coffee-ground emesis and was gastric occult positive.  Patient remained hemodynamically stable.  Patient was placed on Protonix infusion and urine drug screen done shows positive for opiates and marijuana.  Patient admitted for intractable nausea vomiting with hematemesis.  On my exam abdomen appears benign.  Review of Systems: As per HPI, rest all negative.   Past Medical History:  Diagnosis Date  . Cyclic vomiting syndrome   . Diverticulitis   . Family history of adverse reaction to anesthesia    Mother has a hard time waking up  . GERD (gastroesophageal reflux disease)   . History of migraine   . Hypertension   . Murmur   . Renal disorder    Kidney stone    Past Surgical History:  Procedure Laterality Date  . ECTOPIC PREGNANCY SURGERY    . ESOPHAGOGASTRODUODENOSCOPY N/A 08/22/2014   Procedure: ESOPHAGOGASTRODUODENOSCOPY (EGD);  Surgeon: Charna ElizabethJyothi Mann, MD;  Location: [facility] ENDOSCOPY;  Service: Endoscopy;  Laterality: N/A;     reports that she has quit smoking. Her smoking use included cigarettes. She smoked 0.50 packs per day. she has never used smokeless tobacco. She reports that she drinks  alcohol. She reports that she uses drugs. Drug: Marijuana. Frequency: 7.00 times per week.  Allergies  Allergen Reactions  . Aspirin Other (See Comments)    The patient stated that she can not take this medication  . Ciprofloxacin Other (See Comments)    Unknown reaction  . Ibuprofen Other (See Comments)    Patient said she can not take this medication    Family History  Problem Relation Age of Onset  . Migraines Mother   . Diabetes Father   . Breast cancer Paternal Aunt   . Cancer Paternal Grandmother        cervical    Prior to Admission medications   Medication Sig Start Date End Date Taking? Authorizing Provider  acetaminophen (TYLENOL) 500 MG tablet Take 1,000 mg by mouth every 6 (six) hours as needed.   Yes [provider]  amLODipine (NORVASC) 10 MG tablet Take 10 mg by mouth daily.   Yes [provider]  esomeprazole (NEXIUM) 40 MG capsule Take 40 mg by mouth daily at 12 noon.   Yes [provider]  HYDROcodone-acetaminophen (NORCO) 5-325 MG tablet Take 1-2 tablets by mouth every 6 (six) hours as needed. 10/04/16   Geoffery Lyonselo, Douglas, MD  methocarbamol (ROBAXIN) 500 MG tablet Take 1 tablet (500 mg total) by mouth at bedtime as needed for muscle spasms. 07/15/17   Mathews RobinsonsMitchell, Jessica B, PA-C  naproxen (NAPROSYN) 500 MG tablet Take 1 tablet (500 mg total) by mouth 2 (two) times daily with a meal. 07/15/17  Mathews Robinsons B, PA-C  naproxen sodium (ALEVE) 220 MG tablet Take 220 mg by mouth.    [provider]  omeprazole (PRILOSEC) 40 MG capsule Take 40 mg by mouth daily.    [provider]  oxyCODONE (OXY IR/ROXICODONE) 5 MG immediate release tablet Take 1 tablet (5 mg total) by mouth every 4 (four) hours as needed for moderate pain. Patient not taking: Reported on 09/12/2014 08/24/14   Dorothea Ogle, MD  oxyCODONE-acetaminophen (PERCOCET/ROXICET) 5-325 MG tablet Take 1 tablet by mouth every 4 (four) hours as needed for severe pain.  06/16/15   Lyndal Pulley, MD    Physical Exam: Vitals:   08/20/17 0300 08/20/17 0330 08/20/17 0400 08/20/17 0504  BP: (!) 112/59 (!) 99/56  (!) 145/85  Pulse: 72 77 78 83  Resp: 14 16 16 16   Temp:    99 F (37.2 C)  TempSrc:    Oral  SpO2: 99% 98% 100% 100%  Weight:    71.3 kg (157 lb 3.2 oz)  Height:    5\' 11"  (1.803 m)      Constitutional: Moderately built and nourished. Vitals:   08/20/17 0300 08/20/17 0330 08/20/17 0400 08/20/17 0504  BP: (!) 112/59 (!) 99/56  (!) 145/85  Pulse: 72 77 78 83  Resp: 14 16 16 16   Temp:    99 F (37.2 C)  TempSrc:    Oral  SpO2: 99% 98% 100% 100%  Weight:    71.3 kg (157 lb 3.2 oz)  Height:    5\' 11"  (1.803 m)   Eyes: Anicteric no pallor. ENMT: No discharge from the ears eyes nose or mouth. Neck: No mass felt.  No neck rigidity. Respiratory: No rhonchi or crepitations. Cardiovascular: S1-S2 heard no murmurs appreciated. Abdomen: Slight epigastric tenderness no guarding or rigidity. Musculoskeletal: No edema.  No joint effusion. Skin: No rash.  Skin appears warm. Neurologic: Alert awake oriented to time place and person.  Moves all extremities. Psychiatric: Appears normal.  Normal affect.   Labs on Admission: I have personally reviewed following labs and imaging studies  CBC: Recent Labs  Lab 08/19/17 2340 08/20/17 0554  WBC 12.0* 9.4  NEUTROABS 8.8*  --   HGB 10.2* 8.9*  HCT 33.6* 28.3*  MCV 77.1* 76.7*  PLT 260 371   Basic Metabolic Panel: Recent Labs  Lab 08/19/17 2340 08/20/17 0554  NA 137 139  K 3.6 3.9  CL 100* 110  CO2 24 25  GLUCOSE 160* 128*  BUN 26* 19  CREATININE 1.03* 0.74  CALCIUM 9.5 8.5*   GFR: Estimated Creatinine Clearance: 105.5 mL/min (by C-G formula based on SCr of 0.74 mg/dL). Liver Function Tests: Recent Labs  Lab 08/19/17 2340 08/20/17 0554  AST 17 15  ALT 15 13*  ALKPHOS 61 51  BILITOT 0.5 0.5  PROT 8.2* 6.8  ALBUMIN 4.4 3.9   Recent Labs  Lab 08/19/17 2340  LIPASE 23    No results for input(s): AMMONIA in the last 168 hours. Coagulation Profile: No results for input(s): INR, PROTIME in the last 168 hours. Cardiac Enzymes: No results for input(s): CKTOTAL, CKMB, CKMBINDEX, TROPONINI in the last 168 hours. BNP (last 3 results) No results for input(s): PROBNP in the last 8760 hours. HbA1C: No results for input(s): HGBA1C in the last 72 hours. CBG: No results for input(s): GLUCAP in the last 168 hours. Lipid Profile: No results for input(s): CHOL, HDL, LDLCALC, TRIG, CHOLHDL, LDLDIRECT in the last 72 hours. Thyroid Function Tests: No results  for input(s): TSH, T4TOTAL, FREET4, T3FREE, THYROIDAB in the last 72 hours. Anemia Panel: No results for input(s): VITAMINB12, FOLATE, FERRITIN, TIBC, IRON, RETICCTPCT in the last 72 hours. Urine analysis:    Component Value Date/Time   COLORURINE YELLOW 07/15/2017 0959   APPEARANCEUR CLOUDY (A) 07/15/2017 0959   APPEARANCEUR Hazy 11/01/2013 0007   LABSPEC 1.020 07/15/2017 0959   LABSPEC 1.020 11/01/2013 0007   PHURINE 7.5 07/15/2017 0959   GLUCOSEU NEGATIVE 07/15/2017 0959   GLUCOSEU 50 mg/dL 96/10/5407 8119   HGBUR NEGATIVE 07/15/2017 0959   BILIRUBINUR NEGATIVE 07/15/2017 0959   BILIRUBINUR Negative 11/01/2013 0007   KETONESUR NEGATIVE 07/15/2017 0959   PROTEINUR NEGATIVE 07/15/2017 0959   UROBILINOGEN 0.2 03/30/2015 2110   NITRITE NEGATIVE 07/15/2017 0959   LEUKOCYTESUR NEGATIVE 07/15/2017 0959   LEUKOCYTESUR Negative 11/01/2013 0007   Sepsis Labs: @LABRCNTIP (procalcitonin:4,lacticidven:4) )No results found for this or any previous visit (from the past 240 hour(s)).   Radiological Exams on Admission: Dg Chest 2 View  Result Date: 08/20/2017 CLINICAL DATA:  Acute onset of vomiting and diarrhea.  Shaking. EXAM: CHEST  2 VIEW COMPARISON:  Chest radiograph performed 02/06/2017 FINDINGS: The lungs are well-aerated and clear. There is no evidence of focal opacification, pleural effusion or pneumothorax.  The heart is normal in size; the mediastinal contour is within normal limits. No acute osseous abnormalities are seen. IMPRESSION: No acute cardiopulmonary process seen. Electronically Signed   By: Roanna Raider M.D.   On: 08/20/2017 00:15    EKG: Independently reviewed.  Normal sinus rhythm.   Assessment/Plan Principal Problem:   Hematemesis Active Problems:   Marijuana use   Nausea & vomiting   Essential hypertension   Microcytic anemia    1. Hematemesis -patient has been started on Protonix infusion.  We will check serial CBCs.  Consult gastroenterologist in a.m. 2. Intractable nausea vomiting may be likely secondary to marijuana abuse.  Patient presently n.p.o. on PRN IV Zofran and pain relief medications.  Closely observe.  Patient states she quit using marijuana 2 weeks ago. 3. Microcytic hypochromic anemia likely from blood loss.  Check anemia panel and CBC. 4. History of hypertension presently n.p.o. and on PRN IV hydralazine.   DVT prophylaxis: SCDs. Code Status: Full code. Family Communication: Discussed with patient. Disposition Plan: Home. Consults called: None. Admission status: Observation.   Eduard Clos MD [facility] Pager [phone].  If 7PM-7AM, please contact night-coverage [email or web address] Password [email or web address]  08/20/2017, 6:59 AM

## 2017-08-20 NOTE — ED Notes (Signed)
C/o nausea, denies pain, med order received from Dr. Elesa MassedWard. No changes. Pt calm, NAD, interactive, resps e/u, no dyspnea noted, VSS.

## 2017-08-20 NOTE — Op Note (Signed)
[facility] Patient Name: Shari Weeks Procedure Date: 08/20/2017 MRN: [id number] Attending MD: Kerin Salen , MD Date of Birth: 12/08/1975 CSN: [id number] Age: 42 Admit Type: Inpatient Procedure:                Upper GI endoscopy Indications:              Hematemesis Providers:                Kerin Salen, MD, Norman Clay, RN, Harrington Challenger,                            Technician, Judeth Cornfield [place], CRNA Referring MD:              Medicines:                Monitored Anesthesia Care Complications:            No immediate complications. Estimated Blood Loss:     Estimated blood loss: none. Procedure:                Pre-Anesthesia Assessment:                           - Prior to the procedure, a History and Physical                            was performed, and patient medications and                            allergies were reviewed. The patient's tolerance of                            previous anesthesia was also reviewed. The risks                            and benefits of the procedure and the sedation                            options and risks were discussed with the patient.                            All questions were answered, and informed consent                            was obtained. Prior Anticoagulants: The patient has                            taken no previous anticoagulant or antiplatelet                            agents. ASA Grade Assessment: II - A patient with                            mild systemic disease. After reviewing the risks  and benefits, the patient was deemed in                            satisfactory condition to undergo the procedure.                           After obtaining informed consent, the endoscope was                            passed under direct vision. Throughout the                            procedure, the patient's blood pressure, pulse, and                            oxygen saturations were  monitored continuously. The                            Endoscope was introduced through the mouth, and                            advanced to the second part of duodenum. The upper                            GI endoscopy was accomplished without difficulty.                            The patient tolerated the procedure well. Scope In: Scope Out: Findings:      A 3 mm non-bleeding Mallory-Weiss tear with no stigmata of recent       bleeding was found at the GE junction.      The Z-line was regular and was found 38 cm from the incisors.      A 4 cm hiatal hernia was present.      Localized moderately erythematous mucosa without bleeding was found in       the cardia.      The gastric body, gastric antrum, prepyloric region of the stomach and       pylorus were normal.      Diffuse moderately erythematous mucosa without active bleeding and with       no stigmata of bleeding was found in the duodenal bulb and in the first       portion of the duodenum. Impression:               - Mallory-Weiss tear. Diminutive.                           - Z-line regular, 38 cm from the incisors.                           - 4 cm hiatal hernia.                           - Erythematous mucosa in the cardia.                           -  Normal gastric body, antrum, prepyloric region of                            the stomach and pylorus.                           - Erythematous duodenopathy.                           - No specimens collected. Moderate Sedation:      Patient did not receive moderate sedation for this procedure, but       instead received monitored anesthesia care. Recommendation:           - Advance diet as tolerated.                           - Use Protonix (pantoprazole) 40 mg PO daily for 4                            weeks.                           - Avoid NSAIDs, recommend to avoid marijuana as                            patient has cyclical vomiting syndrome(likely cause                             of Mallory Weiss tear and erythematous mucosa in                            cardia from frequent retching).                           - Continue present medications.                           - Return patient to hospital ward for ongoing care. Procedure Code(s):        --- Professional ---                           [phone] [id number], Esophagogastroduodenoscopy, flexible,                            transoral; diagnostic, including collection of                            specimen(s) by brushing or washing, when performed                            (separate procedure) Diagnosis Code(s):        --- Professional ---                           K22.6, Gastro-esophageal laceration-hemorrhage  syndrome                           K44.9, Diaphragmatic hernia without obstruction or                            gangrene                           K31.89, Other diseases of stomach and duodenum                           K92.0, Hematemesis CPT copyright 2016 American Medical Association. All rights reserved. The codes documented in this report are preliminary and upon coder review may  be revised to meet current compliance requirements. Kerin Salen, MD 08/20/2017 2:18:41 PM This report has been signed electronically. Number of Addenda: 0

## 2017-08-20 NOTE — Progress Notes (Signed)
 @IPLOG @        PROGRESS NOTE                                                                                                                                                                                                             Patient Demographics:    Shari Weeks, is a 42 y.o. female, DOB - 08/26/1975, AVW:[id number]  Admit date - 08/19/2017   Admitting Physician Eduard ClosArshad N Kakrakandy, MD  Outpatient Primary MD for the patient is Sydnee CabalShearin, Mary D., MD  LOS - 0  Chief Complaint  Patient presents with  . Emesis       Brief Narrative   Shari HillockDeidra Crusoe is a 42 y.o. female with history of hypertension and previous episodes requiring admissions for intractable nausea vomiting presents to the ER admits [facility] with intractable nausea vomiting for last 3 days.  Unable to keep anything.  Also had occasional diarrhea.  Patient also has benign crampy abdominal pain.  Patient states in August last year she was admitted at [facility] for similar complaints and was found to have gastric ulcer.  Reviewing care everywhere I do not find any EGD results except for in 2013 which was not showing anything acute.     Subjective:    Shari Hillockeidra Hochberg today has, No headache, No chest pain, No abdominal pain - mild Nausea, No new weakness tingling or numbness, No Cough - SOB.     Assessment  & Plan :     1.  Coffee-ground emesis.  Likely mild upper GI bleed due to Mallory-Weiss tear.  Patient has history of the same, this is happening due to cyclical vomiting syndrome caused by marijuana use, she was counseled to abstain, H&H stable we will continue to monitor, continue IV PPI, GI to evaluate likely EGD today.  If stable discharge tomorrow.  2.  Marijuana abuse.  Substance abuse.  Counseled to quit all.  3.  Left breast 10 o'clock position cystic mass.  Outpatient follow-up and monitoring by PCP, patient requested to bring it to PCPs attention within a week after  discharge.    Diet : Diet NPO time specified Except for: Ice Chips   Family Communication  :  none  Code Status :  full  Disposition Plan  :  Home 1-2 days  Consults  : GI  Procedures  :    EGD -   DVT Prophylaxis  :   SCDs    Lab Results  Component Value Date   PLT 371  08/20/2017    Inpatient Medications  Scheduled Meds: . [START ON 08/21/2017] Influenza vac split quadrivalent PF  0.5 mL Intramuscular Tomorrow-1000   Continuous Infusions: . sodium chloride 50 mL/hr at 08/20/17 0837  . pantoprozole (PROTONIX) infusion 8 mg/hr (08/20/17 1102)   PRN Meds:.acetaminophen **OR** acetaminophen, hydrALAZINE, morphine injection, [DISCONTINUED] ondansetron **OR** ondansetron (ZOFRAN) IV  Antibiotics  :    Anti-infectives (From admission, onward)   None         Objective:   Vitals:   08/20/17 0300 08/20/17 0330 08/20/17 0400 08/20/17 0504  BP: (!) 112/59 (!) 99/56  (!) 145/85  Pulse: 72 77 78 83  Resp: 14 16 16 16   Temp:    99 F (37.2 C)  TempSrc:    Oral  SpO2: 99% 98% 100% 100%  Weight:    71.3 kg (157 lb 3.2 oz)  Height:    5\' 11"  (1.803 m)    Wt Readings from Last 3 Encounters:  08/20/17 71.3 kg (157 lb 3.2 oz)  07/15/17 74.8 kg (165 lb)  10/04/16 65.8 kg (145 lb)     Intake/Output Summary (Last 24 hours) at 08/20/2017 1207 Last data filed at 08/20/2017 0600 Gross per 24 hour  Intake 1953.34 ml  Output 175 ml  Net 1778.34 ml     Physical Exam  Awake Alert, Oriented X 3, No new F.N deficits, Normal affect [place].AT,PERRAL Supple Neck,No JVD, No cervical lymphadenopathy appriciated.  Symmetrical Chest wall movement, Good air movement bilaterally, CTAB , left breast cystic mass 10 o'clock position. RRR,No Gallops,Rubs or new Murmurs, No Parasternal Heave +ve B.Sounds, Abd Soft, No tenderness, No organomegaly appriciated, No rebound - guarding or rigidity. No Cyanosis, Clubbing or edema, No new Rash or bruise      Data Review:    CBC Recent Labs   Lab 08/19/17 2340 08/20/17 0554 08/20/17 0922  WBC 12.0* 9.4  --   HGB 10.2* 8.9* 9.3*  HCT 33.6* 28.3* 31.0*  PLT 260 371  --   MCV 77.1* 76.7*  --   MCH 23.4* 24.1*  --   MCHC 30.4 31.4  --   RDW 18.1* 18.0*  --   LYMPHSABS 2.3  --   --   MONOABS 0.8  --   --   EOSABS 0.0  --   --   BASOSABS 0.0  --   --     Chemistries  Recent Labs  Lab 08/19/17 2340 08/20/17 0554  NA 137 139  K 3.6 3.9  CL 100* 110  CO2 24 25  GLUCOSE 160* 128*  BUN 26* 19  CREATININE 1.03* 0.74  CALCIUM 9.5 8.5*  AST 17 15  ALT 15 13*  ALKPHOS 61 51  BILITOT 0.5 0.5   ------------------------------------------------------------------------------------------------------------------ No results for input(s): CHOL, HDL, LDLCALC, TRIG, CHOLHDL, LDLDIRECT in the last 72 hours.  No results found for: HGBA1C ------------------------------------------------------------------------------------------------------------------ No results for input(s): TSH, T4TOTAL, T3FREE, THYROIDAB in the last 72 hours.  Invalid input(s): FREET3 ------------------------------------------------------------------------------------------------------------------ Recent Labs    08/20/17 0922  RETICCTPCT 0.8    Coagulation profile No results for input(s): INR, PROTIME in the last 168 hours.  No results for input(s): DDIMER in the last 72 hours.  Cardiac Enzymes No results for input(s): CKMB, TROPONINI, MYOGLOBIN in the last 168 hours.  Invalid input(s): CK ------------------------------------------------------------------------------------------------------------------ No results found for: BNP  Micro Results No results found for this or any previous visit (from the past 240 hour(s)).  Radiology Reports Dg Chest 2 View  Result Date:  08/20/2017 CLINICAL DATA:  Acute onset of vomiting and diarrhea.  Shaking. EXAM: CHEST  2 VIEW COMPARISON:  Chest radiograph performed 02/06/2017 FINDINGS: The lungs are  well-aerated and clear. There is no evidence of focal opacification, pleural effusion or pneumothorax. The heart is normal in size; the mediastinal contour is within normal limits. No acute osseous abnormalities are seen. IMPRESSION: No acute cardiopulmonary process seen. Electronically Signed   By: Roanna Raider M.D.   On: 08/20/2017 00:15    Time Spent in minutes  30   Susa Raring M.D on 08/20/2017 at 12:07 PM  Between 7am to 7pm - Pager - [phone] ( page via [email or web address], text pages only, please mention full 10 digit call back number). After 7pm go to [email or web address] - password [email or web address]

## 2017-08-20 NOTE — Transfer of Care (Signed)
Immediate Anesthesia Transfer of Care Note  Patient: Shari Weeks  Procedure(s) Performed: ESOPHAGOGASTRODUODENOSCOPY (EGD) WITH PROPOFOL (Left )  Patient Location: PACU and Endoscopy Unit  Anesthesia Type:MAC  Level of Consciousness: awake, alert  and oriented  Airway & Oxygen Therapy: Patient Spontanous Breathing  Post-op Assessment: Report given to RN and Post -op Vital signs reviewed and stable  Post vital signs: Reviewed and stable  Last Vitals:  Vitals:   08/20/17 0504 08/20/17 1254  BP: (!) 145/85 (!) 151/99  Pulse: 83 75  Resp: 16 11  Temp: 37.2 C 36.8 C  SpO2: 100% 100%    Last Pain:  Vitals:   08/20/17 1254  TempSrc: Oral  PainSc:       Patients Stated Pain Goal: 2 (56/38/93 7342)  Complications: No apparent anesthesia complications

## 2017-08-20 NOTE — Consult Note (Addendum)
[facility] Consult  Referring Provider: Midge Minium, MD([facility]) Primary Care Physician:  Sydnee Cabal., MD Primary Gastroenterologist: Gentry Fitz  Reason for Consultation:  Hematemesis, nausea and vomiting  HPI: Shari Weeks is a 42 y.o. female was transferred from [facility] ER with complaints of vomiting blood. Patient states she takes care of her nephews who have been sick with some form of a stomach bug lately, and had diarrhea 4 days prior to presentation(described as multiple loose and watery stools, nonbloody and not black, not associated with abdominal pain, fever/chills or rigors), self-limited after 2 days. 1 day prior to presentation she developed multiple episodes of intractable nausea and vomiting, initially the vomitus contained bilious and clear fluid, but that turned bloody and patient reports multiple episodes of bright red blood and some clots- almost a cup full at times. This is resolved since 6 AM today morning.  Patient reports taking Aleve and Advil multiple times a day on a daily basis for several months due to a herniated disc. She is supposed to be seen at pain management clinic on February 11. Patient also reports that she smokes marijuana, last reported use was 2 weeks ago.  Patient states she had an endoscopy in August of last year at [facility] for similar symptoms and was told she had gastric ulcer. EGD from 2016 performed for hematemesis was reported as basically unremarkable.  Patient states she has lost about 25 pounds in the last 3 months due to lack of appetite, denies early satiety, bloating, difficulty or trouble with swallowing. She c/o acid reflux and regurgitation, takes Nexium on a regular basis.   Past Medical History:  Diagnosis Date  . Cyclic vomiting syndrome   . Diverticulitis   . Family history of adverse reaction to anesthesia    Mother has a hard time waking up  . GERD (gastroesophageal reflux disease)   .  History of migraine   . Hypertension   . Murmur   . Renal disorder    Kidney stone    Past Surgical History:  Procedure Laterality Date  . ECTOPIC PREGNANCY SURGERY    . ESOPHAGOGASTRODUODENOSCOPY N/A 08/22/2014   Procedure: ESOPHAGOGASTRODUODENOSCOPY (EGD);  Surgeon: Charna Elizabeth, MD;  Location: [facility] ENDOSCOPY;  Service: Endoscopy;  Laterality: N/A;    Prior to Admission medications   Medication Sig Start Date End Date Taking? Authorizing Provider  acetaminophen (TYLENOL) 500 MG tablet Take 1,000 mg by mouth every 6 (six) hours as needed (For pain.).    Yes [provider]  amLODipine (NORVASC) 10 MG tablet Take 10 mg by mouth daily.   Yes [provider]  esomeprazole (NEXIUM) 40 MG capsule Take 40 mg by mouth daily.    Yes [provider]  naproxen sodium (ALEVE) 220 MG tablet Take 440 mg by mouth every 8 (eight) hours as needed (For pain.).    Yes [provider]    Current Facility-Administered Medications  Medication Dose Route Frequency Provider Last Rate Last Dose  . 0.9 %  sodium chloride infusion   Intravenous Continuous Leroy Sea, MD 50 mL/hr at 08/20/17 [phone]    . acetaminophen (TYLENOL) tablet 650 mg  650 mg Oral Q6H PRN Eduard Clos, MD       Or  . acetaminophen (TYLENOL) suppository 650 mg  650 mg Rectal Q6H PRN Eduard Clos, MD      . hydrALAZINE (APRESOLINE) injection 10 mg  10 mg Intravenous Q4H PRN Eduard Clos, MD      . [  START ON 08/21/2017] Influenza vac split quadrivalent PF (FLUARIX) injection 0.5 mL  0.5 mL Intramuscular Tomorrow-1000 Eduard Clos, MD      . morphine 2 MG/ML injection 1 mg  1 mg Intravenous Q4H PRN Eduard Clos, MD   1 mg at 08/20/17 0535  . ondansetron (ZOFRAN) injection 4 mg  4 mg Intravenous Q6H PRN Eduard Clos, MD      . pantoprazole (PROTONIX) 80 mg in sodium chloride 0.9 % 250 mL (0.32 mg/mL) infusion  8 mg/hr Intravenous Continuous Aviva Kluver B,  PA-C 25 mL/hr at 08/20/17 0017 8 mg/hr at 08/20/17 0017    Allergies as of 08/19/2017 - Review Complete 08/19/2017  Allergen Reaction Noted  . Aspirin Other (See Comments) 04/12/2014  . Ciprofloxacin Other (See Comments) 08/16/2011  . Ibuprofen Other (See Comments) 04/12/2014    Family History  Problem Relation Age of Onset  . Migraines Mother   . Diabetes Father   . Breast cancer Paternal Aunt   . Cancer Paternal Grandmother        cervical    Social History   Socioeconomic History  . Marital status: Single    Spouse name: Not on file  . Number of children: Not on file  . Years of education: Not on file  . Highest education level: Not on file  Social Needs  . Financial resource strain: Not on file  . Food insecurity - worry: Not on file  . Food insecurity - inability: Not on file  . Transportation needs - medical: Not on file  . Transportation needs - non-medical: Not on file  Occupational History  . Not on file  Tobacco Use  . Smoking status: Former Smoker    Packs/day: 0.50    Types: Cigarettes  . Smokeless tobacco: Never Used  Substance and Sexual Activity  . Alcohol use: Yes    Comment: occasional  . Drug use: Yes    Frequency: 7.0 times per week    Types: Marijuana  . Sexual activity: Yes    Birth control/protection: None  Other Topics Concern  . Not on file  Social History Narrative  . Not on file    Review of Systems: Positive for: GI: Described in detail in HPI.    Gen: involuntary weight loss, anorexia,Denies any fever, chills, rigors, night sweats, fatigue, weakness, malaise, , and sleep disorder CV: Denies chest pain, angina, palpitations, syncope, orthopnea, PND, peripheral edema, and claudication. Resp: Denies dyspnea, cough, sputum, wheezing, coughing up blood. GU : Denies urinary burning, blood in urine, urinary frequency, urinary hesitancy, nocturnal urination, and urinary incontinence. MS: Denies joint pain or swelling.  Denies muscle  weakness, cramps, atrophy.  Derm: Denies rash, itching, oral ulcerations, hives, unhealing ulcers.  Psych: Denies depression, anxiety, memory loss, suicidal ideation, hallucinations,  and confusion. Heme: Vomiting blood Denies bruising and enlarged lymph nodes. Neuro:  Denies any headaches, dizziness, paresthesias. Endo:  Denies any problems with DM, thyroid, adrenal function.  Physical Exam: Vital signs in last 24 hours: Temp:  [98.5 F (36.9 C)-99 F (37.2 C)] 99 F (37.2 C) (02/04 0504) Pulse Rate:  [72-106] 83 (02/04 0504) Resp:  [14-20] 16 (02/04 0504) BP: (99-145)/(56-92) 145/85 (02/04 0504) SpO2:  [97 %-100 %] 100 % (02/04 0504) Weight:  [71.3 kg (157 lb 3.2 oz)-72.6 kg (160 lb)] 71.3 kg (157 lb 3.2 oz) (02/04 0504) Last BM Date: 08/19/17  General:   Alert,  Well-developed, well-nourished, pleasant and cooperative in NAD Head:  Normocephalic and atraumatic.  Eyes: Mild pallor Ears:  Normal auditory acuity. Nose:  No deformity, discharge,  or lesions. Mouth:  No deformity or lesions.  Oropharynx pink & moist. Neck:  Supple; no masses or thyromegaly. Lungs:  Clear throughout to auscultation.   No wheezes, crackles, or rhonchi. No acute distress. Heart:  Regular rate and rhythm; no murmurs, clicks, rubs,  or gallops. Extremities:  Without clubbing or edema. Neurologic:  Alert and  oriented x4;  grossly normal neurologically. Skin:  Intact without significant lesions or rashes. Psych:  Alert and cooperative. Normal mood and affect. Abdomen:  Soft, mild left upper quadrant tenderness, nondistended. No masses, hepatosplenomegaly or hernias noted. Normal bowel sounds, without guarding, and without rebound.         Lab Results: Recent Labs    08/19/17 2340 08/20/17 0554 08/20/17 0922  WBC 12.0* 9.4  --   HGB 10.2* 8.9* 9.3*  HCT 33.6* 28.3* 31.0*  PLT 260 371  --    BMET Recent Labs    08/19/17 2340 08/20/17 0554  NA 137 139  K 3.6 3.9  CL 100* 110  CO2 24 25   GLUCOSE 160* 128*  BUN 26* 19  CREATININE 1.03* 0.74  CALCIUM 9.5 8.5*   LFT Recent Labs    08/20/17 0554  PROT 6.8  ALBUMIN 3.9  AST 15  ALT 13*  ALKPHOS 51  BILITOT 0.5   PT/INR No results for input(s): LABPROT, INR in the last 72 hours.  Studies/Results: Dg Chest 2 View  Result Date: 08/20/2017 CLINICAL DATA:  Acute onset of vomiting and diarrhea.  Shaking. EXAM: CHEST  2 VIEW COMPARISON:  Chest radiograph performed 02/06/2017 FINDINGS: The lungs are well-aerated and clear. There is no evidence of focal opacification, pleural effusion or pneumothorax. The heart is normal in size; the mediastinal contour is within normal limits. No acute osseous abnormalities are seen. IMPRESSION: No acute cardiopulmonary process seen. Electronically Signed   By: Roanna RaiderJeffery  Chang M.D.   On: 08/20/2017 00:15    Impression: 1. Hematemesis-was given Protonix 80 mg IV 1 dose and remains on Protonix drip at 8 mg per hour Intractable nausea vomiting-was given Reglan 10 mg IV 1 dose and remains on Zofran 4 mg IV every 6 hours as needed History of cyclical vomiting syndrome Elevated BUN/creatinine ratio of 26/1.03 Hemoglobin ranges between 10.2/8.9/9.3 Hemodynamically stable  2. U tox positive for opioids and THC  Plan: EGD today, patient remains nothing by mouth Differential diagnosis includes Mallory-Weiss tear/peptic ulcer disease/less likely AVMs/erosive gastritis or esophagitis. Discussed about the risks and benefits of the procedure with the patient. Discussed about use of NSAIDs and adverse side effects associated with it. Discussed about use of THC and association with cyclical vomiting syndrome.    LOS: 0 days   Kerin SalenArya Rosenda Geffrard, M.D.  08/20/2017, 9:51 AM  Pager [phone] If no answer or after 5 PM call [phone]

## 2017-08-20 NOTE — ED Provider Notes (Addendum)
Medical screening examination/treatment/procedure(s) were conducted as a shared visit with non-physician practitioner(s) and myself.  I personally evaluated the patient during the encounter.   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  Reports started having coffee-ground emesis around 7 PM.  Complains of upper abdominal pain and chest discomfort.  Last endoscopy was in July.  No history of esophageal varices.  Patient being treated symptomatically with IV fluids, antiemetics, PPI.  Patient's hemoglobin is 10.2.  She is hemodynamically stable.  Gastric occult is positive.  She denies any bloody stools or melena.  Unfortunately at this time [facility] does not have any beds available for this patient.  She is comfortable with transfer to [facility].  Admitted by hospitalist.  2:48 AM  Dr. Bosie ClosSchooler with [facility] consulted.  He agrees with admission to medicine at [facility].  They will see her in consult in the morning.  CRITICAL CARE Performed by: Rochele RaringKristen Gayle Collard   Total critical care time: 30 minutes  Critical care time was exclusive of separately billable procedures and treating other patients.  Critical care was necessary to treat or prevent imminent or life-threatening deterioration.  Critical care was time spent personally by me on the following activities: development of treatment plan  with patient and/or surrogate as well as nursing, discussions with consultants, evaluation of patient's response to treatment, examination of patient, obtaining history from patient or surrogate, ordering and performing treatments and interventions, ordering and review of laboratory studies, ordering and review of radiographic studies, pulse oximetry and re-evaluation of patient's condition.    Kaisley Stiverson, Layla MawKristen N, DO 08/20/17 0149      Telisha Zawadzki, Layla MawKristen N, DO 08/20/17 [phone]

## 2017-08-21 ENCOUNTER — Encounter (HOSPITAL_COMMUNITY): Payer: Self-pay | Admitting: Gastroenterology

## 2017-08-21 LAB — CBC
HCT: 26.4 % — ABNORMAL LOW (ref 36.0–46.0)
HEMOGLOBIN: 8.1 g/dL — AB (ref 12.0–15.0)
MCH: 23.8 pg — AB (ref 26.0–34.0)
MCHC: 30.7 g/dL (ref 30.0–36.0)
MCV: 77.6 fL — AB (ref 78.0–100.0)
Platelets: 321 10*3/uL (ref 150–400)
RBC: 3.4 MIL/uL — AB (ref 3.87–5.11)
RDW: 18.2 % — ABNORMAL HIGH (ref 11.5–15.5)
WBC: 7.8 10*3/uL (ref 4.0–10.5)

## 2017-08-21 LAB — HIV ANTIBODY (ROUTINE TESTING W REFLEX): HIV SCREEN 4TH GENERATION: NONREACTIVE

## 2017-08-21 LAB — GLUCOSE, CAPILLARY: GLUCOSE-CAPILLARY: 111 mg/dL — AB (ref 65–99)

## 2017-08-21 LAB — HEMOGLOBIN AND HEMATOCRIT, BLOOD
HCT: 28.4 % — ABNORMAL LOW (ref 36.0–46.0)
HEMOGLOBIN: 8.5 g/dL — AB (ref 12.0–15.0)

## 2017-08-21 MED ORDER — FERROUS SULFATE 325 (65 FE) MG PO TABS: 325.0000 mg | ORAL_TABLET | Freq: Three times a day (TID) | ORAL | 0 refills | Status: AC

## 2017-08-21 NOTE — Progress Notes (Signed)
Discharge instructions reviewed with patient. All questions answered. Patient wheeled down to vehicle with belongings by nurse tech. 

## 2017-08-21 NOTE — Anesthesia Postprocedure Evaluation (Signed)
Anesthesia Post Note  Patient: Shari Weeks  Procedure(s) Performed: ESOPHAGOGASTRODUODENOSCOPY (EGD) WITH PROPOFOL (Left )     Patient location during evaluation: Endoscopy Anesthesia Type: MAC Level of consciousness: awake Pain management: pain level controlled Vital Signs Assessment: post-procedure vital signs reviewed and stable Respiratory status: spontaneous breathing Cardiovascular status: stable    Last Vitals:  Vitals:   08/20/17 2048 08/21/17 0606  BP: 138/88 99/71  Pulse: 69 82  Resp: 16 16  Temp: 37.1 C 37.1 C  SpO2: 100% 100%    Last Pain:  Vitals:   08/21/17 0606  TempSrc: Oral  PainSc:                  Caeleb Batalla

## 2017-08-21 NOTE — Discharge Instructions (Signed)
Follow with Primary MD Sydnee CabalShearin, Mary D., MD in 7 days , get a detailed breast exam done along with anemia panel checked.  Get CBC, CMP, Anemia panel checked  by Primary MD in 5-7 days    Activity: As tolerated with Full fall precautions use walker/cane & assistance as needed  Disposition Home    Diet:  Heart Healthy    For Heart failure patients - Check your Weight same time everyday, if you gain over 2 pounds, or you develop in leg swelling, experience more shortness of breath or chest pain, call your Primary MD immediately. Follow Cardiac Low Salt Diet and 1.5 lit/day fluid restriction.  Special Instructions: If you have smoked or chewed Tobacco  in the last 2 yrs please stop smoking, stop any regular Alcohol  and or any Recreational drug use.  On your next visit with your primary care physician please Get Medicines reviewed and adjusted.  Please request your Prim.MD to go over all Hospital Tests and Procedure/Radiological results at the follow up, please get all Hospital records sent to your Prim MD by signing hospital release before you go home.  If you experience worsening of your admission symptoms, develop shortness of breath, life threatening emergency, suicidal or homicidal thoughts you must seek medical attention immediately by calling 911 or calling your MD immediately  if symptoms less severe.  You Must read complete instructions/literature along with all the possible adverse reactions/side effects for all the Medicines you take and that have been prescribed to you. Take any new Medicines after you have completely understood and accpet all the possible adverse reactions/side effects.   Do not drive, operate heavy machinery, perform activities at heights, swimming or participation in water activities or provide baby sitting services if your were admitted for syncope or siezures until you have seen by Primary MD or a Neurologist and advised to do so again.  Do not drive when taking  Pain medications.    Do not take more than prescribed Pain, Sleep and Anxiety Medications  Wear Seat belts while driving.   Please note  You were cared for by a hospitalist during your hospital stay. If you have any questions about your discharge medications or the care you received while you were in the hospital after you are discharged, you can call the unit and asked to speak with the hospitalist on call if the hospitalist that took care of you is not available. Once you are discharged, your primary care physician will handle any further medical issues. Please note that NO REFILLS for any discharge medications will be authorized once you are discharged, as it is imperative that you return to your primary care physician (or establish a relationship with a primary care physician if you do not have one) for your aftercare needs so that they can reassess your need for medications and monitor your lab values.

## 2017-08-21 NOTE — Discharge Summary (Signed)
Shari HillockDeidra Weeks VHQ:[id number] DOB: 09/26/1975 DOA: 08/19/2017  PCP: Shari Weeks  Admit date: 08/19/2017  Discharge date: 08/21/2017  Admitted From: Home   Disposition:  Home   Recommendations for Outpatient Follow-up:   Follow up with PCP in 1-2 Weeks  PCP Please obtain BMP/CBC, 2 view CXR in 1week,  (see Discharge instructions)   PCP Please follow up on the following pending results: Panel, needs evaluation of left breast lump   Home Health: none  Equipment/Devices: none  Consultations: GI Discharge Condition: Stable   CODE STATUS: Full   Diet Recommendation:  Heart Healthy    Chief Complaint  Patient presents with  . Emesis     Brief history of present illness from the day of admission and additional interim summary     Shari Everettis a 42 y.o.femalewithhistory of hypertension and previous episodes requiring admissions for intractable nausea vomiting presents to the ER admits [facility] with intractable nausea vomiting for last 3 days. Unable to keep anything. Also had occasional diarrhea. Patient also has benign crampy abdominal pain. Patient states in August last year she was admitted at [facility] for similar complaints and was found to have gastric ulcer. Reviewing care everywhere I do not find any EGD results except for in 2013 which was not showing anything acute.                                                                    Hospital Course    1.  Coffee-ground emesis.  Due to mild upper GI bleed caused by Mallory-Weiss tear.  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  Will follow with PCP and GI outpatient closely.  Has been requested to avoid NSAIDs.  2.  Marijuana abuse.  Substance abuse.  Counseled to quit all.  3.  Left breast 10 o'clock position cystic mass.  Outpatient follow-up and monitoring by PCP, patient requested to bring it to PCPs attention within a week after discharge.  Patient given written instructions for follow-up and monitoring.     Discharge diagnosis     Principal Problem:   Hematemesis Active Problems:   Marijuana use   Nausea & vomiting   Essential hypertension   Microcytic anemia    Discharge instructions    Discharge Instructions    Diet - low sodium heart healthy   Complete by:  As directed    Discharge instructions  Complete by:  As directed    Follow with Primary Weeks Shari Weeks in 7 days , get a detailed breast exam done along with anemia panel checked.  Get CBC, CMP, Anemia panel checked  by Primary Weeks in 5-7 days    Activity: As tolerated with Full fall precautions use walker/cane & assistance as needed  Disposition Home    Diet:  Heart Healthy    For Heart failure patients - Check your Weight same time everyday, if you gain over 2 pounds, or you develop in leg swelling, experience more shortness of breath or chest pain, call your Primary Weeks immediately. Follow Cardiac Low Salt Diet and 1.5 lit/day fluid restriction.  Special Instructions: If you have smoked or chewed Tobacco  in the last 2 yrs please stop smoking, stop any regular Alcohol  and or any Recreational drug use.  On your next visit with your primary care physician please Get Medicines reviewed and adjusted.  Please request your Prim.Weeks to go over all Hospital Tests and Procedure/Radiological results at the follow up, please get all Hospital records  sent to your Prim Weeks by signing hospital release before you go home.  If you experience worsening of your admission symptoms, develop shortness of breath, life threatening emergency, suicidal or homicidal thoughts you must seek medical attention immediately by calling 911 or calling your Weeks immediately  if symptoms less severe.  You Must read complete instructions/literature along with all the possible adverse reactions/side effects for all the Medicines you take and that have been prescribed to you. Take any new Medicines after you have completely understood and accpet all the possible adverse reactions/side effects.   Do not drive, operate heavy machinery, perform activities at heights, swimming or participation in water activities or provide baby sitting services if your were admitted for syncope or siezures until you have seen by Primary Weeks or a Neurologist and advised to do so again.  Do not drive when taking Pain medications.    Do not take more than prescribed Pain, Sleep and Anxiety Medications  Wear Seat belts while driving.   Please note  You were cared for by a hospitalist during your hospital stay. If you have any questions about your discharge medications or the care you received while you were in the hospital after you are discharged, you can call the unit and asked to speak with the hospitalist on call if the hospitalist that took care of you is not available. Once you are discharged, your primary care physician will handle any further medical issues. Please note that NO REFILLS for any discharge medications will be authorized once you are discharged, as it is imperative that you return to your primary care physician (or establish a relationship with a primary care physician if you do not have one) for your aftercare needs so that they can reassess your need for medications and monitor your lab values.   Increase activity slowly   Complete by:  As directed       Discharge  Medications   Allergies as of 08/21/2017      Reactions   Aspirin Other (See Comments)   GI upset   Ciprofloxacin Other (See Comments)   Unknown reaction   Ibuprofen Other (See Comments)   GI upset      Medication List    STOP taking these medications   naproxen sodium 220 MG tablet Commonly known as:  ALEVE     TAKE these medications   acetaminophen  500 MG tablet Commonly known as:  TYLENOL Take 1,000 mg by mouth every 6 (six) hours as needed (For pain.).   amLODipine 10 MG tablet Commonly known as:  NORVASC Take 10 mg by mouth daily.   esomeprazole 40 MG capsule Commonly known as:  NEXIUM Take 40 mg by mouth daily.   ferrous sulfate 325 (65 FE) MG tablet Take 1 tablet (325 mg total) by mouth 3 (three) times daily with meals.       Follow-up Information    Shari Weeks. Schedule an appointment as soon as possible for a visit in 1 week(s).   Specialty:  Gastroenterology          Major procedures and Radiology Reports - PLEASE review detailed and final reports thoroughly  -     EGD - mallory weiss tear, hiatal hernia, gastritis, duodenitis      Dg Chest 2 View  Result Date: 08/20/2017 CLINICAL DATA:  Acute onset of vomiting and diarrhea.  Shaking. EXAM: CHEST  2 VIEW COMPARISON:  Chest radiograph performed 02/06/2017 FINDINGS: The lungs are well-aerated and clear. There is no evidence of focal opacification, pleural effusion or pneumothorax. The heart is normal in size; the mediastinal contour is within normal limits. No acute osseous abnormalities are seen. IMPRESSION: No acute cardiopulmonary process seen. Electronically Signed   By: Roanna Raider M.D.   On: 08/20/2017 00:15    Micro Results     No results found for this or any previous visit (from the past 240 hour(s)).  Today   Subjective    Shari Weeks today has no headache,no chest abdominal pain,no new weakness tingling or numbness, feels much better wants to go home today.       Objective   Blood pressure 99/71, pulse 82, temperature 98.7 F (37.1 C), temperature source Oral, resp. rate 16, height 5\' 11"  (1.803 m), weight 71.3 kg (157 lb 3.2 oz), last menstrual period 08/16/2017, SpO2 100 %.   Intake/Output Summary (Last 24 hours) at 08/21/2017 0828 Last data filed at 08/21/2017 0600 Gross per 24 hour  Intake 2019.17 ml  Output 200 ml  Net 1819.17 ml    Exam Awake Alert, Oriented x 3, No new F.N deficits, Normal affect Unity.AT,PERRAL Supple Neck,No JVD, No cervical lymphadenopathy appriciated.  Symmetrical Chest wall movement, Good air movement bilaterally, CTAB RRR,No Gallops,Rubs or new Murmurs, No Parasternal Heave +ve B.Sounds, Abd Soft, Non tender, No organomegaly appriciated, No rebound -guarding or rigidity. No Cyanosis, Clubbing or edema, No new Rash or bruise   Data Review   CBC w Diff:  Lab Results  Component Value Date   WBC 7.8 08/21/2017   HGB 8.1 (L) 08/21/2017   HGB 10.5 (L) 10/31/2013   HCT 26.4 (L) 08/21/2017   HCT 35.1 10/31/2013   PLT 321 08/21/2017   PLT 397 10/31/2013   LYMPHOPCT 19 08/19/2017   LYMPHOPCT 13.3 10/31/2013   MONOPCT 7 08/19/2017   MONOPCT 2.8 10/31/2013   EOSPCT 0 08/19/2017   EOSPCT 0.1 10/31/2013   BASOPCT 0 08/19/2017   BASOPCT 0.8 10/31/2013    CMP:  Lab Results  Component Value Date   NA 139 08/20/2017   NA 139 10/31/2013   K 3.9 08/20/2017   K 3.8 10/31/2013   CL 110 08/20/2017   CL 104 10/31/2013   CO2 25 08/20/2017   CO2 22 10/31/2013   BUN 19 08/20/2017   BUN 9 10/31/2013   CREATININE 0.74 08/20/2017   CREATININE 0.90 10/31/2013  PROT 6.8 08/20/2017   PROT 7.9 10/31/2013   ALBUMIN 3.9 08/20/2017   ALBUMIN 4.1 10/31/2013   BILITOT 0.5 08/20/2017   BILITOT 0.6 10/31/2013   ALKPHOS 51 08/20/2017   ALKPHOS 73 10/31/2013   AST 15 08/20/2017   AST 15 10/31/2013   ALT 13 (L) 08/20/2017   ALT 19 10/31/2013  .   Total Time in preparing paper work, data evaluation and todays exam - 35  minutes  Susa Raring M.D on 08/21/2017 at 8:28 AM  [facility]   Office  [phone]

## 2018-05-10 ENCOUNTER — Other Ambulatory Visit: Payer: Self-pay | Admitting: Physician Assistant

## 2018-05-10 DIAGNOSIS — N739 Female pelvic inflammatory disease, unspecified: Secondary | ICD-10-CM

## 2019-08-23 IMAGING — DX DG CHEST 2V
4 series · 4 of 4 positions shown · non-contrast
Comparison: Chest radiograph performed 02/06/2017

CLINICAL DATA: Acute onset of vomiting and diarrhea.  Shaking.

EXAM:
CHEST  2 VIEW

[chest lat (1 of 2)]
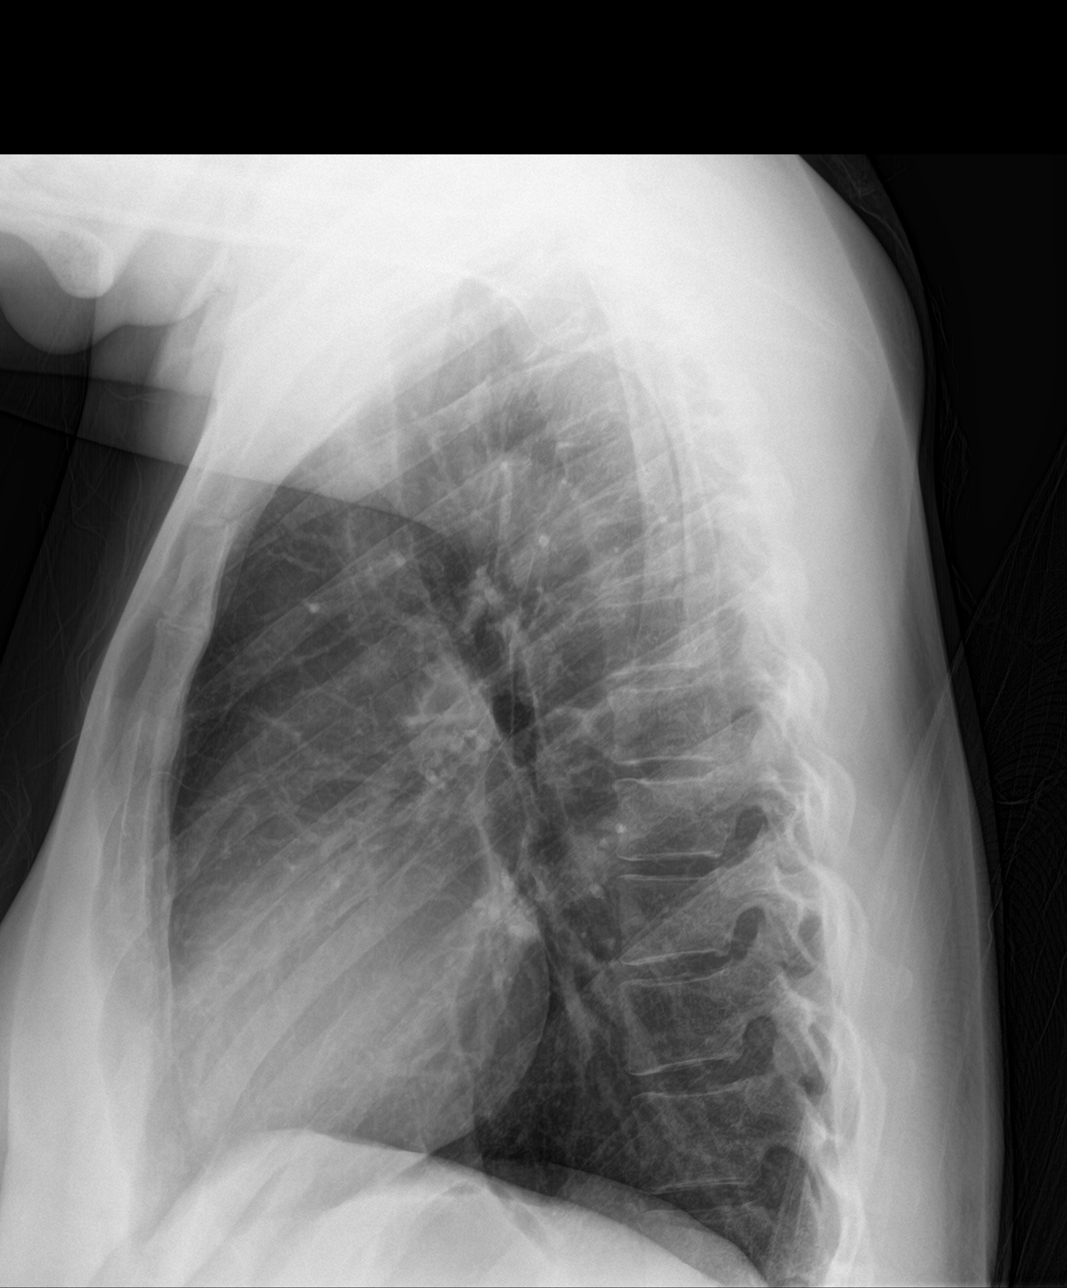

[chest ap]
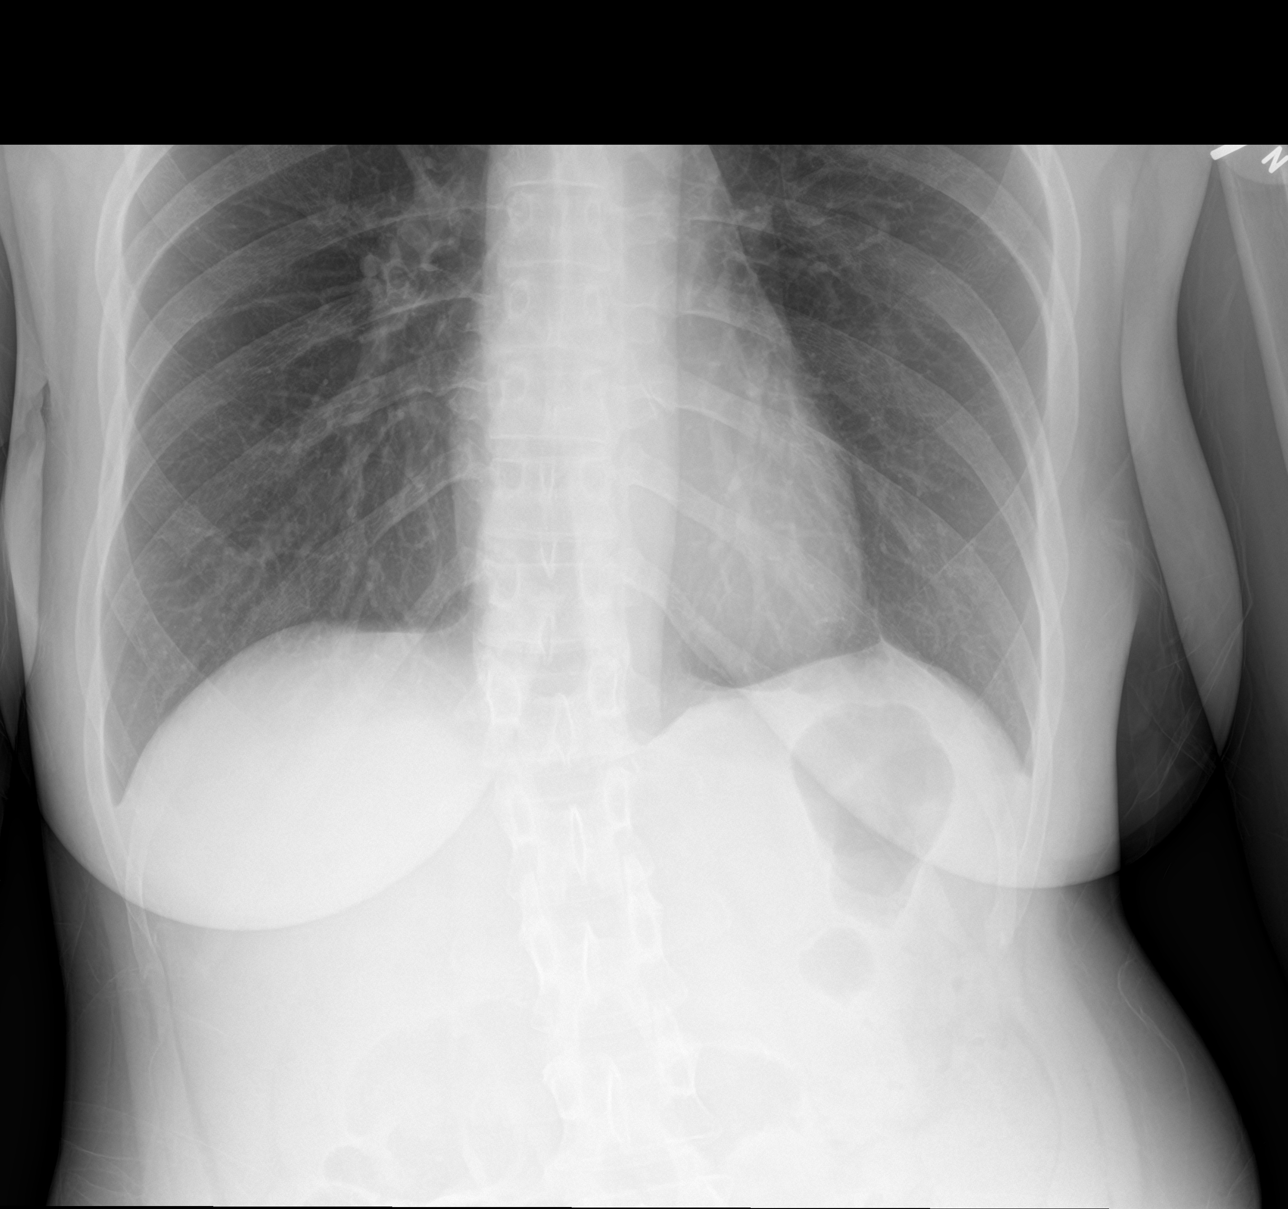

[chest ap strecther]
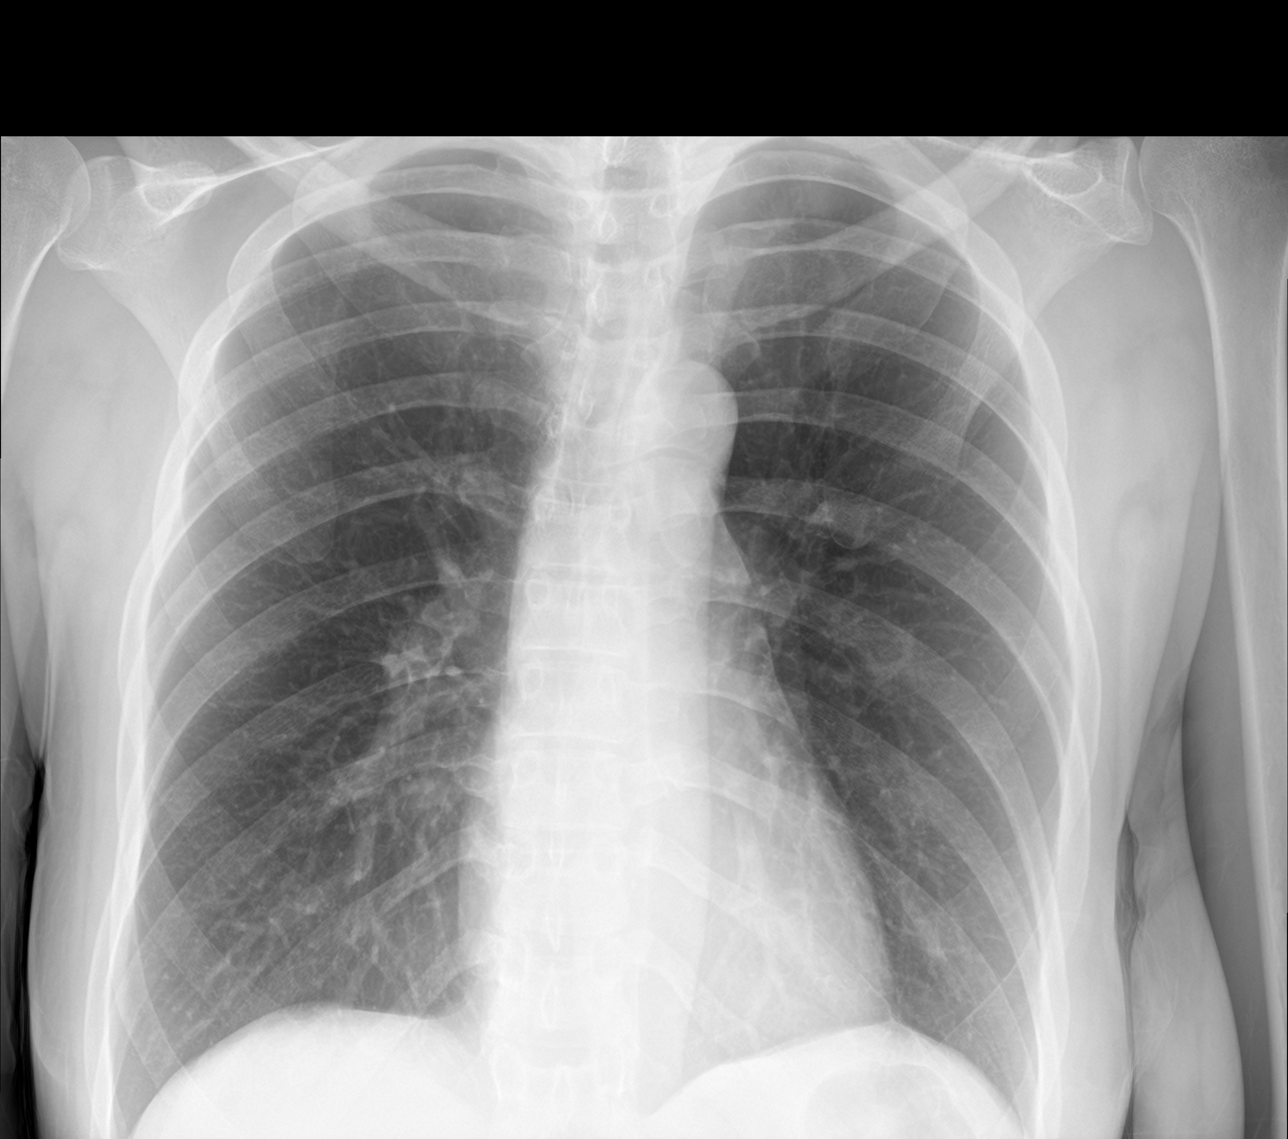

[chest lat (2 of 2)]
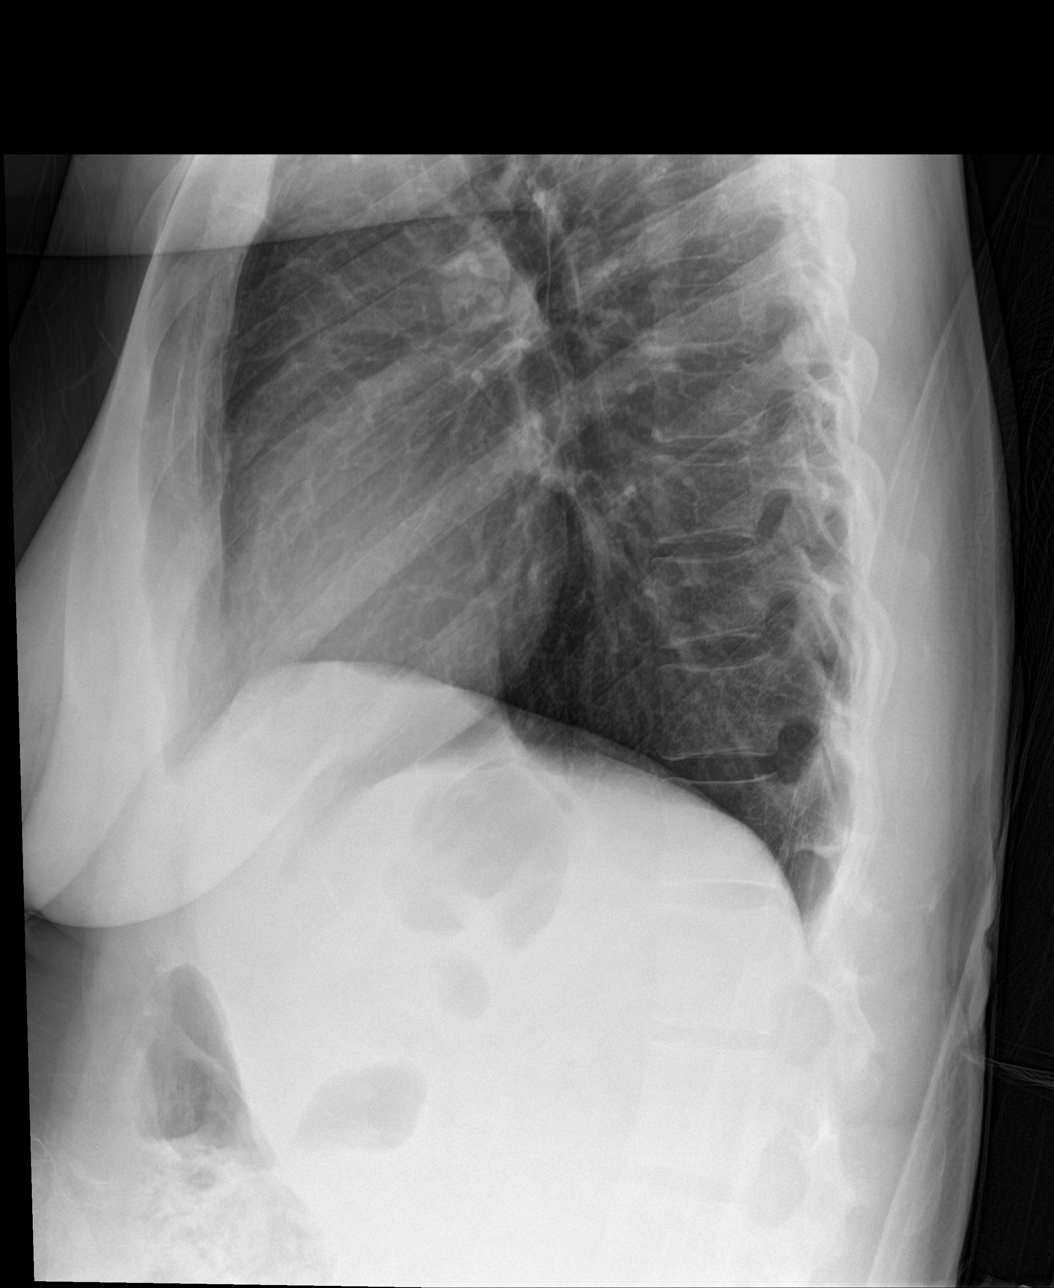

[4 of 4 positions shown; findings below may reference images not displayed]

FINDINGS: The lungs are well-aerated and clear. There is no evidence of focal
opacification, pleural effusion or pneumothorax.

The heart is normal in size; the mediastinal contour is within
normal limits. No acute osseous abnormalities are seen.
IMPRESSION: No acute cardiopulmonary process seen.

## 2020-02-10 ENCOUNTER — Encounter (HOSPITAL_BASED_OUTPATIENT_CLINIC_OR_DEPARTMENT_OTHER): Payer: Self-pay | Admitting: Emergency Medicine

## 2020-02-10 ENCOUNTER — Other Ambulatory Visit: Payer: Self-pay

## 2020-02-10 ENCOUNTER — Emergency Department (HOSPITAL_BASED_OUTPATIENT_CLINIC_OR_DEPARTMENT_OTHER)
Admission: EM | Admit: 2020-02-10 | Discharge: 2020-02-10 | Disposition: A | Discharge status: Home / Self Care | Payer: Self-pay | Attending: Emergency Medicine | Admitting: Emergency Medicine

## 2020-02-10 DIAGNOSIS — M545 Low back pain, unspecified: Secondary | ICD-10-CM

## 2020-02-10 DIAGNOSIS — I10 Essential (primary) hypertension: Secondary | ICD-10-CM | POA: Insufficient documentation

## 2020-02-10 DIAGNOSIS — Z79899 Other long term (current) drug therapy: Secondary | ICD-10-CM | POA: Insufficient documentation

## 2020-02-10 DIAGNOSIS — F17290 Nicotine dependence, other tobacco product, uncomplicated: Secondary | ICD-10-CM | POA: Insufficient documentation

## 2020-02-10 LAB — URINALYSIS, ROUTINE W REFLEX MICROSCOPIC
Bilirubin Urine: NEGATIVE
Glucose, UA: NEGATIVE mg/dL
Ketones, ur: NEGATIVE mg/dL
Leukocytes,Ua: NEGATIVE
Nitrite: NEGATIVE
Protein, ur: NEGATIVE mg/dL
Specific Gravity, Urine: 1.02 (ref 1.005–1.030)
pH: 7 (ref 5.0–8.0)

## 2020-02-10 LAB — URINALYSIS, MICROSCOPIC (REFLEX)

## 2020-02-10 MED ORDER — ONDANSETRON 4 MG PO TBDP
8.0000 mg | ORAL_TABLET | Freq: Once | ORAL | Status: AC
  Filled 2020-02-10: qty 2

## 2020-02-10 MED ORDER — HYDROCODONE-ACETAMINOPHEN 5-325 MG PO TABS: 1.0000 | ORAL_TABLET | Freq: Four times a day (QID) | ORAL | 0 refills | Status: AC | PRN

## 2020-02-10 MED ORDER — HYDROMORPHONE HCL 1 MG/ML IJ SOLN
1.0000 mg | Freq: Once | INTRAMUSCULAR | Status: AC
  Administered 2020-02-10: 1 mg via INTRAMUSCULAR
  Filled 2020-02-10: qty 1

## 2020-02-10 MED ORDER — ONDANSETRON 4 MG PO TBDP
ORAL_TABLET | ORAL | Status: AC
  Administered 2020-02-10: 8 mg via ORAL
  Filled 2020-02-10: qty 1

## 2020-02-10 NOTE — Discharge Instructions (Signed)

## 2020-02-10 NOTE — ED Provider Notes (Signed)
[facility] EMERGENCY DEPARTMENT Provider Note   CSN: [id number] Arrival date & time: 02/10/20  0326     History Chief Complaint  Patient presents with   Back Pain    Shari Weeks is a 44 y.o. female.  The history is provided by the patient.  Back Pain Location:  Thoracic spine and lumbar spine Quality:  Stiffness and aching Radiates to:  Does not radiate Pain severity:  Moderate Onset quality:  Gradual Duration:  2 weeks Timing:  Constant Progression:  Worsening Chronicity:  New Relieved by:  Nothing Worsened by:  Movement Associated symptoms: no abdominal pain, no bladder incontinence, no bowel incontinence, no dysuria, no fever, no numbness, no tingling and no weakness   Risk factors: no hx of cancer    Patient with history of kidney stones, GERD and previous history of lumbar discectomy presents with back pain.  She reports over the past 2 weeks she began having pain and stiffness in the middle and low back.  No traumas or falls.  She reports it seems to get worse whenever it rains.  No leg weakness.  No incontinence.  No history of cancer or IV drug abuse.  She has recently quit smoking marijuana    Past Medical History:  Diagnosis Date   Cyclic vomiting syndrome    Diverticulitis    Family history of adverse reaction to anesthesia    Mother has a hard time waking up   GERD (gastroesophageal reflux disease)    History of migraine    Hypertension    Murmur    Renal disorder    Kidney stone    Patient Active Problem List   Diagnosis Date Noted   Marijuana use 08/20/2017   Nausea & vomiting 08/20/2017   Essential hypertension 08/20/2017   Microcytic anemia 08/20/2017   Cyclic vomiting syndrome 08/22/2014   Hematemesis 08/22/2014   Hematemesis with nausea 08/22/2014    Past Surgical History:  Procedure Laterality Date   ABDOMINAL HYSTERECTOMY     BACK SURGERY     ECTOPIC PREGNANCY SURGERY     ESOPHAGOGASTRODUODENOSCOPY  N/A 08/22/2014   Procedure: ESOPHAGOGASTRODUODENOSCOPY (EGD);  Surgeon: Charna Elizabeth, MD;  Location: [facility];  Service: Endoscopy;  Laterality: N/A;   ESOPHAGOGASTRODUODENOSCOPY (EGD) WITH PROPOFOL Left 08/20/2017   Procedure: ESOPHAGOGASTRODUODENOSCOPY (EGD) WITH PROPOFOL;  Surgeon: Kerin Salen, MD;  Location: [facility];  Service: Gastroenterology;  Laterality: Left;     OB History    Gravida  3   Para      Term      Preterm      AB  3   Living        SAB  1   TAB      Ectopic  2   Multiple      Live Births              Family History  Problem Relation Age of Onset   Migraines Mother    Diabetes Father    Breast cancer Paternal Aunt    Cancer Paternal Grandmother        cervical    Social History   Tobacco Use   Smoking status: Current Every Day Smoker    Packs/day: 0.50    Types: Cigars   Smokeless tobacco: Never Used  Vaping Use   Vaping Use: Never used  Substance Use Topics   Alcohol use: Not Currently    Comment: occasional   Drug use: Not Currently    Frequency: 7.0  times per week    Types: Marijuana    Comment: have not smoked in 3 weeks     Home Medications Prior to Admission medications   Medication Sig Start Date End Date Taking? Authorizing Provider  acetaminophen (TYLENOL) 500 MG tablet Take 1,000 mg by mouth every 6 (six) hours as needed (For pain.).     [provider]  amLODipine (NORVASC) 10 MG tablet Take 10 mg by mouth daily.    [provider]  esomeprazole (NEXIUM) 40 MG capsule Take 40 mg by mouth daily.     [provider]  ferrous sulfate 325 (65 FE) MG tablet Take 1 tablet (325 mg total) by mouth 3 (three) times daily with meals. 08/21/17   Leroy Sea, MD    Allergies    Aspirin, Ciprofloxacin, Doxycycline, Flagyl [metronidazole], and Ibuprofen  Review of Systems   Review of Systems  Constitutional: Negative for fever.  Gastrointestinal: Negative for abdominal pain and  bowel incontinence.  Genitourinary: Negative for bladder incontinence, difficulty urinating and dysuria.  Musculoskeletal: Positive for back pain.  Neurological: Negative for tingling, weakness and numbness.  All other systems reviewed and are negative.   Physical Exam Updated Vital Signs BP (!) 152/98 (BP Location: Left Arm)    Pulse 80    Temp 98.4 F (36.9 C)    Resp 19    Ht 1.803 m (5\' 11" )    Wt 61.2 kg    LMP 08/16/2017    SpO2 99%    BMI 18.83 kg/m   Physical Exam CONSTITUTIONAL: Well developed/well nourished HEAD: Normocephalic/atraumatic EYES: EOMI/PERRL NECK: supple no meningeal signs SPINE/BACK: Diffuse thoracic or lumbar tenderness.  Well-healed incision noted in the lumbar spine.  No bruising/crepitance/stepoffs noted to spine CV: S1/S2 noted, no murmurs/rubs/gallops noted LUNGS: Lungs are clear to auscultation bilaterally, no apparent distress ABDOMEN: soft, nontender, no rebound or guarding GU:no cva tenderness NEURO: Awake/alert, equal motor 5/5 strength noted with the following: hip flexion/knee flexion/extension, foot dorsi/plantar flexion, great toe extension intact bilaterally, no clonus bilaterally, plantar reflex appropriate (toes downgoing), no sensory deficit in any dermatome.  Equal patellar/achilles reflex noted (2+) in bilateral lower extremities.  EXTREMITIES: pulses normal, full ROM SKIN: warm, color normal PSYCH: no abnormalities of mood noted, alert and oriented to situation  ED Results / Procedures / Treatments   Labs (all labs ordered are listed, but only abnormal results are displayed) Labs Reviewed  URINALYSIS, ROUTINE W REFLEX MICROSCOPIC - Abnormal; Notable for the following components:      Result Value   APPearance CLOUDY (*)    Hgb urine dipstick TRACE (*)    All other components within normal limits  URINALYSIS, MICROSCOPIC (REFLEX) - Abnormal; Notable for the following components:   Bacteria, UA FEW (*)    All other components within  normal limits    EKG None  Radiology No results found.  Procedures Procedures  Medications Ordered in ED Medications  HYDROmorphone (DILAUDID) injection 1 mg (1 mg Intramuscular Given 02/10/20 0602)    ED Course  I have reviewed the triage vital signs and the nursing notes.  Pertinent labs  results that were available during my care of the patient were reviewed by me and considered in my medical decision making (see chart for details).    MDM Rules/Calculators/A&P                          Patient presents for back pain.  No trauma reported.  She reports this feels very similar to prior episodes of back pain.  Patient underwent a lumbar discectomy in 2019 has done well since that time.  She has no incontinence or neuro deficits.  Pain medication will be given here. 6:36 AM Patient improved.  She is ambulatory.  She is in no acute distress.  No indication for emergent imaging at this time.  However I advise close follow-up with her neurosurgeon as she may need outpatient MRI.  We had a discussion about strict return precautions.  Patient agreeable with plan.  Final Clinical Impression(s) / ED Diagnoses Final diagnoses:  Acute midline low back pain without sciatica    Rx / DC Orders ED Discharge Orders         Ordered    HYDROcodone-acetaminophen (NORCO/VICODIN) 5-325 MG tablet  Every 6 hours PRN     Discontinue  Reprint     02/10/20 0636           Zadie Rhine, MD 02/10/20 [phone]

## 2020-02-10 NOTE — ED Triage Notes (Signed)
Sept 2019 she had a herniated disc and had surgery  Pt states she has been having back pain for the past week   Pt denies injury  Pt states the pain is in the middle of her back down to her lower back
